# Patient Record
Sex: Female | Born: 1935 | Race: White | Hispanic: No | Smoking: Never smoker
Health system: Southern US, Community
[De-identification: ages and names within clinical notes are randomized; demographics above are authoritative.]

## PROBLEM LIST (undated history)

## (undated) DIAGNOSIS — M1990 Unspecified osteoarthritis, unspecified site: Secondary | ICD-10-CM

## (undated) DIAGNOSIS — K219 Gastro-esophageal reflux disease without esophagitis: Secondary | ICD-10-CM

## (undated) DIAGNOSIS — C801 Malignant (primary) neoplasm, unspecified: Secondary | ICD-10-CM

## (undated) DIAGNOSIS — R413 Other amnesia: Secondary | ICD-10-CM

## (undated) DIAGNOSIS — I10 Essential (primary) hypertension: Secondary | ICD-10-CM

## (undated) DIAGNOSIS — R000 Tachycardia, unspecified: Secondary | ICD-10-CM

## (undated) DIAGNOSIS — I739 Peripheral vascular disease, unspecified: Secondary | ICD-10-CM

## (undated) DIAGNOSIS — M5481 Occipital neuralgia: Secondary | ICD-10-CM

## (undated) DIAGNOSIS — I209 Angina pectoris, unspecified: Secondary | ICD-10-CM

## (undated) DIAGNOSIS — R002 Palpitations: Secondary | ICD-10-CM

## (undated) DIAGNOSIS — H269 Unspecified cataract: Secondary | ICD-10-CM

## (undated) DIAGNOSIS — I351 Nonrheumatic aortic (valve) insufficiency: Secondary | ICD-10-CM

## (undated) DIAGNOSIS — R519 Headache, unspecified: Secondary | ICD-10-CM

## (undated) DIAGNOSIS — I447 Left bundle-branch block, unspecified: Secondary | ICD-10-CM

## (undated) DIAGNOSIS — M1120 Other chondrocalcinosis, unspecified site: Secondary | ICD-10-CM

## (undated) DIAGNOSIS — I2510 Atherosclerotic heart disease of native coronary artery without angina pectoris: Secondary | ICD-10-CM

## (undated) DIAGNOSIS — E785 Hyperlipidemia, unspecified: Secondary | ICD-10-CM

## (undated) DIAGNOSIS — I499 Cardiac arrhythmia, unspecified: Secondary | ICD-10-CM

## (undated) DIAGNOSIS — I700 Atherosclerosis of aorta: Secondary | ICD-10-CM

## (undated) DIAGNOSIS — C44311 Basal cell carcinoma of skin of nose: Secondary | ICD-10-CM

## (undated) DIAGNOSIS — M069 Rheumatoid arthritis, unspecified: Secondary | ICD-10-CM

## (undated) DIAGNOSIS — R778 Other specified abnormalities of plasma proteins: Secondary | ICD-10-CM

## (undated) DIAGNOSIS — E538 Deficiency of other specified B group vitamins: Secondary | ICD-10-CM

## (undated) HISTORY — PX: CATARACT EXTRACTION: SUR2

## (undated) HISTORY — PX: CORONARY ANGIOPLASTY: SHX604

## (undated) HISTORY — PX: BACK SURGERY: SHX140

## (undated) HISTORY — PX: OTHER SURGICAL HISTORY: SHX169

## (undated) HISTORY — PX: EYE SURGERY: SHX253

---

## 1991-03-01 HISTORY — PX: BACK SURGERY: SHX140

## 2005-01-07 ENCOUNTER — Ambulatory Visit: Payer: Self-pay | Admitting: Orthopedic Surgery

## 2005-01-14 ENCOUNTER — Ambulatory Visit: Payer: Self-pay | Admitting: Orthopedic Surgery

## 2007-01-31 ENCOUNTER — Ambulatory Visit: Payer: Self-pay | Admitting: Cardiology

## 2012-05-03 ENCOUNTER — Ambulatory Visit: Payer: Self-pay | Admitting: Internal Medicine

## 2012-06-19 ENCOUNTER — Ambulatory Visit: Payer: Self-pay | Admitting: Internal Medicine

## 2015-01-20 ENCOUNTER — Encounter: Payer: Self-pay | Admitting: *Deleted

## 2015-01-26 ENCOUNTER — Ambulatory Visit: Payer: Medicare Other | Admitting: *Deleted

## 2015-01-26 ENCOUNTER — Encounter: Payer: Self-pay | Admitting: *Deleted

## 2015-01-26 ENCOUNTER — Encounter
Admission: RE | Disposition: A | Discharge status: Home / Self Care | Payer: Self-pay | Source: Ambulatory Visit | Attending: Ophthalmology

## 2015-01-26 ENCOUNTER — Ambulatory Visit
Admission: RE | Admit: 2015-01-26 | Discharge: 2015-01-26 | Disposition: A | Discharge status: Home / Self Care | Payer: Medicare Other | Source: Ambulatory Visit | Attending: Ophthalmology | Admitting: Ophthalmology

## 2015-01-26 DIAGNOSIS — M1990 Unspecified osteoarthritis, unspecified site: Secondary | ICD-10-CM | POA: Insufficient documentation

## 2015-01-26 DIAGNOSIS — H25012 Cortical age-related cataract, left eye: Secondary | ICD-10-CM | POA: Insufficient documentation

## 2015-01-26 DIAGNOSIS — Z9841 Cataract extraction status, right eye: Secondary | ICD-10-CM | POA: Insufficient documentation

## 2015-01-26 DIAGNOSIS — I10 Essential (primary) hypertension: Secondary | ICD-10-CM | POA: Diagnosis not present

## 2015-01-26 DIAGNOSIS — Z886 Allergy status to analgesic agent status: Secondary | ICD-10-CM | POA: Insufficient documentation

## 2015-01-26 DIAGNOSIS — Z9861 Coronary angioplasty status: Secondary | ICD-10-CM | POA: Insufficient documentation

## 2015-01-26 DIAGNOSIS — K219 Gastro-esophageal reflux disease without esophagitis: Secondary | ICD-10-CM | POA: Diagnosis not present

## 2015-01-26 DIAGNOSIS — I499 Cardiac arrhythmia, unspecified: Secondary | ICD-10-CM | POA: Diagnosis not present

## 2015-01-26 DIAGNOSIS — H2512 Age-related nuclear cataract, left eye: Secondary | ICD-10-CM | POA: Diagnosis not present

## 2015-01-26 DIAGNOSIS — H269 Unspecified cataract: Secondary | ICD-10-CM | POA: Diagnosis present

## 2015-01-26 HISTORY — DX: Essential (primary) hypertension: I10

## 2015-01-26 HISTORY — DX: Gastro-esophageal reflux disease without esophagitis: K21.9

## 2015-01-26 HISTORY — PX: CATARACT EXTRACTION W/PHACO: SHX586

## 2015-01-26 HISTORY — DX: Unspecified osteoarthritis, unspecified site: M19.90

## 2015-01-26 HISTORY — DX: Cardiac arrhythmia, unspecified: I49.9

## 2015-01-26 SURGERY — PHACOEMULSIFICATION, CATARACT, WITH IOL INSERTION
Anesthesia: Monitor Anesthesia Care | Site: Eye | Laterality: Left | Wound class: Clean

## 2015-01-26 MED ORDER — TETRACAINE HCL 0.5 % OP SOLN
OPHTHALMIC | Status: AC
  Filled 2015-01-26: qty 2

## 2015-01-26 MED ORDER — NA CHONDROIT SULF-NA HYALURON 40-17 MG/ML IO SOLN
INTRAOCULAR | Status: AC
  Filled 2015-01-26: qty 1

## 2015-01-26 MED ORDER — CYCLOPENTOLATE HCL 2 % OP SOLN
1.0000 [drp] | OPHTHALMIC | Status: DC | PRN
  Administered 2015-01-26: 1 [drp] via OPHTHALMIC

## 2015-01-26 MED ORDER — MOXIFLOXACIN HCL 0.5 % OP SOLN
OPHTHALMIC | Status: DC | PRN
  Administered 2015-01-26: 1 [drp] via OPHTHALMIC

## 2015-01-26 MED ORDER — LIDOCAINE HCL (PF) 4 % IJ SOLN
INTRAMUSCULAR | Status: AC
  Filled 2015-01-26: qty 5

## 2015-01-26 MED ORDER — BUPIVACAINE HCL (PF) 0.75 % IJ SOLN
INTRAMUSCULAR | Status: AC
  Filled 2015-01-26: qty 10

## 2015-01-26 MED ORDER — PROPARACAINE HCL 0.5 % OP SOLN
OPHTHALMIC | Status: AC
  Filled 2015-01-26: qty 15

## 2015-01-26 MED ORDER — TRYPAN BLUE 0.06 % OP SOLN
OPHTHALMIC | Status: AC
  Filled 2015-01-26: qty 0.5

## 2015-01-26 MED ORDER — PHENYLEPHRINE HCL 10 % OP SOLN
OPHTHALMIC | Status: AC
  Filled 2015-01-26: qty 5

## 2015-01-26 MED ORDER — MOXIFLOXACIN HCL 0.5 % OP SOLN
1.0000 [drp] | OPHTHALMIC | Status: DC | PRN
  Administered 2015-01-26: 1 [drp] via OPHTHALMIC

## 2015-01-26 MED ORDER — EPINEPHRINE HCL 1 MG/ML IJ SOLN
INTRAOCULAR | Status: DC | PRN
  Administered 2015-01-26: 1 mL via OPHTHALMIC

## 2015-01-26 MED ORDER — ALFENTANIL 500 MCG/ML IJ INJ
INJECTION | INTRAMUSCULAR | Status: DC | PRN
  Administered 2015-01-26: 500 ug via INTRAVENOUS

## 2015-01-26 MED ORDER — HYALURONIDASE HUMAN 150 UNIT/ML IJ SOLN
INTRAMUSCULAR | Status: AC
  Filled 2015-01-26: qty 1

## 2015-01-26 MED ORDER — LIDOCAINE HCL (PF) 4 % IJ SOLN
INTRAMUSCULAR | Status: DC | PRN
  Administered 2015-01-26: 4 mL via OPHTHALMIC

## 2015-01-26 MED ORDER — EPINEPHRINE HCL 1 MG/ML IJ SOLN
INTRAMUSCULAR | Status: AC
  Filled 2015-01-26: qty 1

## 2015-01-26 MED ORDER — MOXIFLOXACIN HCL 0.5 % OP SOLN
OPHTHALMIC | Status: AC
  Filled 2015-01-26: qty 3

## 2015-01-26 MED ORDER — NA CHONDROIT SULF-NA HYALURON 40-17 MG/ML IO SOLN
INTRAOCULAR | Status: DC | PRN
  Administered 2015-01-26: 1 mL via INTRAOCULAR

## 2015-01-26 MED ORDER — TETRACAINE HCL 0.5 % OP SOLN
OPHTHALMIC | Status: DC | PRN
  Administered 2015-01-26: 1 [drp] via OPHTHALMIC

## 2015-01-26 MED ORDER — BSS IO SOLN
INTRAOCULAR | Status: DC | PRN
  Administered 2015-01-26: .5 mL via OPHTHALMIC

## 2015-01-26 MED ORDER — CEFUROXIME OPHTHALMIC INJECTION 1 MG/0.1 ML
INJECTION | OPHTHALMIC | Status: DC | PRN
Start: 2015-01-26 — End: 2015-01-26
  Administered 2015-01-26: .1 mL via INTRACAMERAL

## 2015-01-26 MED ORDER — PHENYLEPHRINE HCL 10 % OP SOLN
1.0000 [drp] | OPHTHALMIC | Status: DC | PRN
  Administered 2015-01-26: 1 [drp] via OPHTHALMIC

## 2015-01-26 MED ORDER — CARBACHOL 0.01 % IO SOLN
INTRAOCULAR | Status: DC | PRN
  Administered 2015-01-26: .5 mL via INTRAOCULAR

## 2015-01-26 MED ORDER — GLYCOPYRROLATE 0.2 MG/ML IJ SOLN
INTRAMUSCULAR | Status: DC | PRN
  Administered 2015-01-26: 0.2 mg via INTRAVENOUS

## 2015-01-26 MED ORDER — SODIUM CHLORIDE 0.9 % IV SOLN
INTRAVENOUS | Status: DC
  Administered 2015-01-26: 09:00:00 via INTRAVENOUS

## 2015-01-26 MED ORDER — CYCLOPENTOLATE HCL 2 % OP SOLN
OPHTHALMIC | Status: AC
  Filled 2015-01-26: qty 2

## 2015-01-26 SURGICAL SUPPLY — 29 items

## 2015-01-26 NOTE — Transfer of Care (Signed)
Immediate Anesthesia Transfer of Care Note  Patient: Alicia Lambert  Procedure(s) Performed: Procedure(s) with comments: CATARACT EXTRACTION PHACO AND INTRAOCULAR LENS PLACEMENT (IOC) (Left) - [place] 01:49 AP% 24.2 CDE 45.30 fluid pack lot # [id number]  Patient Location: PACU and Short Stay  Anesthesia Type:MAC  Level of Consciousness: awake, alert  and oriented  Airway & Oxygen Therapy: Patient Spontanous Breathing  Post-op Assessment: Report given to RN and Post -op Vital signs reviewed and stable  Post vital signs: Reviewed and stable  Last Vitals:  Filed Vitals:   01/26/15 0745 01/26/15 0950  BP: 151/70 147/70  Pulse: 56   Temp: 36.7 C 36.4 C  Resp: 16 18    Complications: No apparent anesthesia complications

## 2015-01-26 NOTE — Op Note (Signed)
Date of Surgery: 01/26/2015 Date of Dictation: 01/26/2015 9:46 AM Pre-operative Diagnosis:  Nuclear Sclerotic Cataract and Cortical Cataract left Eye Post-operative Diagnosis: same Procedure performed: Extra-capsular Cataract Extraction (ECCE) with placement of a posterior chamber intraocular lens (IOL) left Eye IOL:  Implant Name Type Inv. Item Serial No. Manufacturer Lot No. LRB No. Used  LENS IOL ACRYSOF IQ 20.5 - [id number] Intraocular Lens LENS IOL ACRYSOF IQ 20.5 [id number] ALCON   Left 1   Anesthesia: 2% Lidocaine and 4% Marcaine in a 50/50 mixture with 10 unites/ml of Hylenex given as a peribulbar Anesthesiologist: Anesthesiologist: Elyse Hsu, MD CRNA: Jonna Clark, CRNA Complications: none Estimated Blood Loss: less than 1 ml  Description of procedure:  The patient was given anesthesia and sedation via intravenous access. The patient was then prepped and draped in the usual fashion. A 25-gauge needle was bent for initiating the capsulorhexis. A 5-0 silk suture was placed through the conjunctiva superior and inferiorly to serve as bridle sutures. Hemostasis was obtained at the superior limbus using an eraser cautery. A partial thickness groove was made at the anterior surgical limbus with a 64 Beaver blade and this was dissected anteriorly with an Avaya. The anterior chamber was entered at 10 o'clock with a 1.0 mm paracentesis knife and through the lamellar dissection with a 2.6 mm Alcon keratome. Epi-Shugarcaine 0.5 CC [9 cc BSS Plus (Alcon), 3 cc 4% preservative-free lidocaine (Hospira) and 4 cc 1:1000 preservative-free, bisulfite-free epinephrine] was injected into the anterior chamber via the paracentesis tract. Epi-Shugarcaine 0.5 CC [9 cc BSS Plus (Alcon), 3 cc 4% preservative-free lidocaine (Hospira) and 4 cc 1:1000 preservative-free, bisulfite-free epinephrine] was injected into the anterior chamber via the paracentesis tract. DiscoVisc was injected to replace  the aqueous and a continuous tear curvilinear capsulorhexis was performed using a bent 25-gauge needle.  Balance salt on a syringe was used to perform hydro-dissection and phacoemulsification was carried out using a divide and conquer technique. Procedure(s) with comments: CATARACT EXTRACTION PHACO AND INTRAOCULAR LENS PLACEMENT (IOC) (Left) - [place] 01:49 AP% 24.2 CDE 45.30 fluid pack lot # [id number]. Irrigation/aspiration was used to remove the residual cortex and the capsular bag was inflated with DiscoVisc. The intraocular lens was inserted into the capsular bag using a pre-loaded UltraSert Delivery System. Irrigation/aspiration was used to remove the residual DiscoVisc. The wound was inflated with balanced salt and checked for leaks. None were found. Miostat was injected via the paracentesis track and 0.1 ml of cefuroxime containing 1 mg of drug  was injected via the paracentesis track. The wound was checked for leaks again and none were found.   The bridal sutures were removed and two drops of Vigamox were placed on the eye. An eye shield was placed to protect the eye and the patient was discharged to the recovery area in good condition.   Sidra Oldfield MD

## 2015-01-26 NOTE — Discharge Instructions (Signed)
Eye Surgery Discharge Instructions  Expect mild scratchy sensation or mild soreness. DO NOT RUB YOUR EYE!  The day of surgery:  Minimal physical activity, but bed rest is not required  No reading, computer work, or close hand work  No bending, lifting, or straining.  May watch TV  For 24 hours:  No driving, legal decisions, or alcoholic beverages  Safety precautions  Eat anything you prefer: It is better to start with liquids, then soup then solid foods.  _____ Eye patch should be worn until postoperative exam tomorrow.  ____ Solar shield eyeglasses should be worn for comfort in the sunlight/patch while sleeping  Resume all regular medications including aspirin or Coumadin if these were discontinued prior to surgery. You may shower, bathe, shave, or wash your hair. Tylenol may be taken for mild discomfort.  Call your doctor if you experience significant pain, nausea, or vomiting, fever > 101 or other signs of infection. [phone] or [phone] Specific instructions:  Follow-up Information    Follow up with Estill Cotta, MD.   Specialty:  Ophthalmology   Why:  01/27/15 @ 10:40 am   Contact information:   [address]   [place] [address] [phone]

## 2015-01-26 NOTE — Anesthesia Postprocedure Evaluation (Signed)
Anesthesia Post Note  Patient: Alicia Lambert  Procedure(s) Performed: Procedure(s) (LRB): CATARACT EXTRACTION PHACO AND INTRAOCULAR LENS PLACEMENT (IOC) (Left)  Patient location during evaluation: Short Stay Anesthesia Type: MAC Level of consciousness: awake and alert and oriented Pain management: pain level controlled Vital Signs Assessment: post-procedure vital signs reviewed and stable Respiratory status: spontaneous breathing Cardiovascular status: stable Postop Assessment: No headache Anesthetic complications: no    Last Vitals:  Filed Vitals:   01/26/15 0948 01/26/15 0950  BP: 147/70 147/70  Pulse: 58   Temp: 35.6 C 36.4 C  Resp: 16 18    Last Pain: There were no vitals filed for this visit.               Lanora Manis

## 2015-01-26 NOTE — H&P (Signed)
See scanned note.

## 2015-01-26 NOTE — Anesthesia Preprocedure Evaluation (Signed)
Anesthesia Evaluation  Patient identified by MRN, date of birth, ID band Patient awake    Reviewed: Allergy & Precautions, NPO status , Patient's Chart, lab work & pertinent test results  Airway Mallampati: III  TM Distance: >3 FB Neck ROM: Limited    Dental  (+) Teeth Intact   Pulmonary    Pulmonary exam normal        Cardiovascular Exercise Tolerance: Good hypertension, Pt. on medications and Pt. on home beta blockers Normal cardiovascular exam  Angioplasty and has done well.   Neuro/Psych    GI/Hepatic negative GI ROS, Denies GERD.   Endo/Other    Renal/GU      Musculoskeletal  (+) Arthritis , Osteoarthritis,    Abdominal (+)  Abdomen: soft.    Peds  Hematology   Anesthesia Other Findings   Reproductive/Obstetrics                             Anesthesia Physical Anesthesia Plan  ASA: III  Anesthesia Plan: MAC   Post-op Pain Management:    Induction: Intravenous  Airway Management Planned: Nasal Cannula  Additional Equipment:   Intra-op Plan:   Post-operative Plan:   Informed Consent: I have reviewed the patients History and Physical, chart, labs and discussed the procedure including the risks, benefits and alternatives for the proposed anesthesia with the patient or authorized representative who has indicated his/her understanding and acceptance.     Plan Discussed with: CRNA  Anesthesia Plan Comments:         Anesthesia Quick Evaluation

## 2015-01-26 NOTE — Interval H&P Note (Signed)
History and Physical Interval Note:  01/26/2015 8:56 AM  Alicia Lambert  has presented today for surgery, with the diagnosis of cataract  The various methods of treatment have been discussed with the patient and family. After consideration of risks, benefits and other options for treatment, the patient has consented to  Procedure(s): CATARACT EXTRACTION PHACO AND INTRAOCULAR LENS PLACEMENT ([place]) (Left) as a surgical intervention .  The patient's history has been reviewed, patient examined, no change in status, stable for surgery.  I have reviewed the patient's chart and labs.  Questions were answered to the patient's satisfaction.     Keyon Winnick

## 2015-03-24 ENCOUNTER — Ambulatory Visit: Payer: Medicare HMO | Attending: Orthopedic Surgery | Admitting: Occupational Therapy

## 2015-03-24 DIAGNOSIS — M25641 Stiffness of right hand, not elsewhere classified: Secondary | ICD-10-CM | POA: Insufficient documentation

## 2015-03-24 DIAGNOSIS — M6281 Muscle weakness (generalized): Secondary | ICD-10-CM | POA: Diagnosis present

## 2015-03-24 DIAGNOSIS — M79641 Pain in right hand: Secondary | ICD-10-CM | POA: Diagnosis present

## 2015-03-24 NOTE — Therapy (Signed)
[facility] [address], [place], [address] Phone: [phone]   Fax:  [phone]  Occupational Therapy Treatment  Patient Details  Name: Alicia Lambert MRN: [id number] Date of Birth: 1927-12-13 Referring Provider: Rudene Christians  Encounter Date: 03/24/2015      OT End of Session - 03/24/15 1840    Visit Number 1   Number of Visits 3   Date for OT Re-Evaluation 04/14/15   OT Start Time 1015   OT Stop Time 1116   OT Time Calculation (min) 61 min   Activity Tolerance Patient tolerated treatment well   Behavior During Therapy [facility] for tasks assessed/performed      Past Medical History  Diagnosis Date  . Dysrhythmia   . Hypertension   . GERD (gastroesophageal reflux disease)   . Arthritis     Past Surgical History  Procedure Laterality Date  . Back surgery    . Ctr    . Eye surgery    . Coronary angioplasty    . Cataract extraction w/phaco Left 01/26/2015    Procedure: CATARACT EXTRACTION PHACO AND INTRAOCULAR LENS PLACEMENT (IOC);  Surgeon: Estill Cotta, MD;  Location: [facility] ORS;  Service: Ophthalmology;  Laterality: Left;  [place] 01:49 AP% 24.2 CDE 45.30 fluid pack lot # [id number]    There were no vitals filed for this visit.  Visit Diagnosis:  Pain of right hand - Plan: Ot plan of care cert/re-cert  Stiffness of finger joint of right hand - Plan: Ot plan of care cert/re-cert  Muscle weakness - Plan: Ot plan of care cert/re-cert      Subjective Assessment - 03/24/15 1818    Subjective  My R hand gradually got worse - pain and edema - it started in my pinkie last time and Dr Rudene Christians gave me a shot - it is all my fingers and over my knuckles - stiff and pain - cannot grip objects - hard time open bottles, cut food , or with scissors, squeeze anything - I  stopped using my R hand - and now my L hand starting same thing on the pinkie    Patient Stated Goals Want to get the pain and stiffness better- use  it like before - carry objects, use in bathing and dressing, turn doorknob , , push up with my hand    Currently in Pain? Yes   Pain Score 6    Pain Location Hand   Pain Orientation Right;Left   Pain Descriptors / Indicators Aching   Pain Type Chronic pain   Pain Onset More than a month ago            [facility] OT Assessment - 03/24/15 0001    Assessment   Diagnosis R hand pain in joints    Referring Provider Menz   Onset Date 04/29/14   Assessment Pt report pain had been in R hand for while and gradually got worse - started with her pinkie- more stiffness and pain - sometimes numbness - having harder time using them - L hand starting with pinkie too - no grip - had shot in past in R pinkie that helped - refer by MD for therapy    Home  Environment   Lives With Alone   Prior Function   Level of [name]   Leisure She is always busy and likes to be outside per pt - she is R hand dominant - works in yard Set designer,  cook , help her older sister that lives next door,    Strength   Right Hand Grip (lbs) 10   Right Hand Lateral Pinch 5 lbs   Right Hand 3 Point Pinch 5 lbs   Left Hand Grip (lbs) 30   Left Hand Lateral Pinch 8 lbs   Left Hand 3 Point Pinch 6 lbs   Right Hand AROM   R Thumb Opposition to Index --  Pull when reaching to bottom for 5th    R Index  MCP 0-90 75 Degrees   R Index PIP 0-100 100 Degrees   R Long  MCP 0-90 85 Degrees   R Long PIP 0-100 100 Degrees   R Ring  MCP 0-90 8 Degrees   R Ring PIP 0-100 100 Degrees   R Little  MCP 0-90 90 Degrees   R Little PIP 0-100 60 Degrees                          OT Education - 03/24/15 1840    Education provided Yes   Education Details see pt instruction   Person(s) Educated Patient   Methods Explanation;Demonstration;Tactile cues;Verbal cues;Handout   Comprehension Verbal cues required;Returned demonstration;Verbalized understanding          OT Short Term Goals -  03/24/15 1847    OT SHORT TERM GOAL #1   Title Decrease pain on PRWHE by at least 10 points    Baseline PRWHE for pain 41/50   Time 2   Period Weeks   Status New   OT SHORT TERM GOAL #2   Title R 5th digit PIP improve with 10 degrees at least    Baseline PIP flexion at 5th 60   Time 3   Period Weeks   Status New           OT Long Term Goals - 03/24/15 1848    OT LONG TERM GOAL #1   Title PRWHE function improve with  at least 15 points   Baseline 33.5/50 function on PRWHE    Time 3   Period Weeks   Status New   OT LONG TERM GOAL #2   Title Pt verbalize 3 joint protection and AE to increase ease at home with ADL's and IADL's    Baseline very little knowledge   Time 3   Period Weeks   Status New               Plan - 03/24/15 1841    Clinical Impression Statement Pt present with pain/edema in R hand - mostly over 2nd and 3rd MC's but per report having it over whole hand at times when using it alot - but mornings stiffness and pain the worse - pt show decrease ROM mostly in 5th PIP - decrease grip  and prehension strength - pt  report  only numbness at times in 4th/5h - Pt was negative for Cubital tunnel  , and  negative for Tinel  and  Phalens test for CTS - pt do report cervical issue - with pain at times - if no improvement in 2 wks will  recommend cervical screen and  nerve conduction    Pt will benefit from skilled therapeutic intervention in order to improve on the following deficits (Retired) Impaired flexibility;Decreased range of motion;Decreased knowledge of precautions;Decreased knowledge of use of DME;Impaired UE functional use;Pain;Decreased strength   Rehab Potential Fair   OT Frequency 2x / week   OT  Duration 2 weeks   OT Treatment/Interventions Self-care/ADL training;DME and/or AE instruction;Patient/family education;Therapeutic exercises;Parrafin;Manual Therapy;Passive range of motion;Therapeutic exercise;Contrast Bath;Fluidtherapy   Plan assess home  program and if she modified the activities    OT Home Exercise Plan see pt instruction    Consulted and Agree with Plan of Care Patient          G-Codes - 04-07-2015 1851    Functional Assessment Tool Used PRWHE, pain ,ROM , grip , clinical judgement   Functional Limitation Self care   Self Care Current Status [id number]) At least 40 percent but less than 60 percent impaired, limited or restricted   Self Care Goal Status [id number]) At least 20 percent but less than 40 percent impaired, limited or restricted      Problem List There are no active problems to display for this patient.   Rosalyn Gess OTR/L,CLT 07-Apr-2015, 6:55 PM  [facility] [address] [place], [address] Phone: [phone]   Fax:  [phone]  Name: Alicia Lambert MRN: [id number] Date of Birth: Nov 27, 1927

## 2015-03-24 NOTE — Patient Instructions (Signed)
AROM for Tendon glides after contrast bath for edema and stiffness/pain  8 reps each  Stop when feeling pull   Joint protection education done  Adaptive Equipment ed done - Hand out provided

## 2015-03-31 ENCOUNTER — Ambulatory Visit: Payer: Medicare HMO | Admitting: Occupational Therapy

## 2015-03-31 DIAGNOSIS — M25641 Stiffness of right hand, not elsewhere classified: Secondary | ICD-10-CM

## 2015-03-31 DIAGNOSIS — M79641 Pain in right hand: Secondary | ICD-10-CM

## 2015-03-31 DIAGNOSIS — M6281 Muscle weakness (generalized): Secondary | ICD-10-CM

## 2015-03-31 NOTE — Therapy (Signed)
[facility] [address] [place], [address] Phone: [phone]   Fax:  [phone]  Occupational Therapy Treatment  Patient Details  Name: Alicia Lambert MRN: [id number] Date of Birth: 08-Jan-1928 Referring Provider: Rudene Christians  Encounter Date: 03/31/2015      OT End of Session - 03/31/15 1039    Visit Number 2   Number of Visits 3   Date for OT Re-Evaluation 04/14/15   OT Start Time 1016   OT Stop Time 1100   OT Time Calculation (min) 44 min   Activity Tolerance Patient tolerated treatment well   Behavior During Therapy [name] for tasks assessed/performed      Past Medical History  Diagnosis Date  . Dysrhythmia   . Hypertension   . GERD (gastroesophageal reflux disease)   . Arthritis     Past Surgical History  Procedure Laterality Date  . Back surgery    . Ctr    . Eye surgery    . Coronary angioplasty    . Cataract extraction w/phaco Left 01/26/2015    Procedure: CATARACT EXTRACTION PHACO AND INTRAOCULAR LENS PLACEMENT (IOC);  Surgeon: Estill Cotta, MD;  Location: [facility] ORS;  Service: Ophthalmology;  Laterality: Left;  [place] 01:49 AP% 24.2 CDE 45.30 fluid pack lot # [id number]    There were no vitals filed for this visit.  Visit Diagnosis:  Pain of right hand  Stiffness of finger joint of right hand  Muscle weakness      Subjective Assessment - 03/31/15 1029    Subjective  I think my finger is little better- can bend little more - but in the am  my finger and hand stiff  and more pain - and then some of the pain goes up my side for forearm    Patient Stated Goals Want to get the pain and stiffness better- use it like before - carry objects, use in bathing and dressing, turn doorknob , , push up with my hand    Currently in Pain? Yes   Pain Score 6    Pain Location Finger (Comment which one)   Pain Orientation Right   Pain Descriptors / Indicators Aching   Pain Type Chronic pain                       OT Treatments/Exercises (OP) - 03/31/15 0001    Hand Exercises   Other Hand Exercises AROM lumbrical , PROM of 4th DIP , AROM blocked intrinsic fist, full fist to 2 cm foam AROM , then 1 cm foam block    Other Hand Exercises Needed verbal cues to do composite flexion including DIP of 5th  and not to squeeze hard and try forcing to palm    RUE Contrast Bath   Time 11 minutes   Comments prior to ROM  to decrease pain and increase ROM prior to manual and ROM - pain decrease to 2/10 form 6/10   Manual Therapy   Manual therapy comments soft tissue mobs to MC's of hand - and forearm on ulnar side -- tenderness and pain over 2nd and 3rd MC's and dorsal hand ulnar side                  OT Education - 03/31/15 1038    Education provided Yes   Education Details See pt instuction   Person(s) Educated Patient   Methods Explanation;Demonstration;Tactile cues   Comprehension Verbalized understanding;Returned demonstration  OT Short Term Goals - 03/24/15 1847    OT SHORT TERM GOAL #1   Title Decrease pain on PRWHE by at least 10 points    Baseline PRWHE for pain 41/50   Time 2   Period Weeks   Status New   OT SHORT TERM GOAL #2   Title R 5th digit PIP improve with 10 degrees at least    Baseline PIP flexion at 5th 60   Time 3   Period Weeks   Status New           OT Long Term Goals - 03/24/15 1848    OT LONG TERM GOAL #1   Title PRWHE function improve with  at least 15 points   Baseline 33.5/50 function on PRWHE    Time 3   Period Weeks   Status New   OT LONG TERM GOAL #2   Title Pt verbalize 3 joint protection and AE to increase ease at home with ADL's and IADL's    Baseline very little knowledge   Time 3   Period Weeks   Status New               Plan - 03/31/15 1100    Clinical Impression Statement Pt pain still about 6/10 coming in - did reinforce for pt to not over do and work fingers during day to much - but  stick with 2-3 x day - AROM at 5th PIP improved but  report numbness over radial 3 digis - did wear hre old CT splint yesterday when doing  tasks - did not had any triggerpoints over forearm - some tenderness over CT  and ulnar side of hand /MC of 2nd digitt    Pt will benefit from skilled therapeutic intervention in order to improve on the following deficits (Retired) Impaired flexibility;Decreased range of motion;Decreased knowledge of precautions;Decreased knowledge of use of DME;Impaired UE functional use;Pain;Decreased strength   Rehab Potential Fair   OT Frequency 2x / week   OT Duration 2 weeks   OT Treatment/Interventions Self-care/ADL training;DME and/or AE instruction;Patient/family education;Therapeutic exercises;Parrafin;Manual Therapy;Passive range of motion;Therapeutic exercise;Contrast Bath;Fluidtherapy   Plan assess pain , ROM , assess splint fit    OT Home Exercise Plan see pt instruction    Consulted and Agree with Plan of Care Patient        Problem List There are no active problems to display for this patient.   Rosalyn Gess OTR/L,CLT 03/31/2015, 11:52 AM  [facility] [address] [place], [address] Phone: [phone]   Fax:  [phone]  Name: Alicia Lambert MRN: [id number] Date of Birth: Jul 29, 1927

## 2015-03-31 NOTE — Patient Instructions (Signed)
Tendon glides  Contrast prior

## 2015-04-02 ENCOUNTER — Ambulatory Visit: Payer: Medicare HMO | Attending: Orthopedic Surgery | Admitting: Occupational Therapy

## 2015-04-02 DIAGNOSIS — M25641 Stiffness of right hand, not elsewhere classified: Secondary | ICD-10-CM | POA: Diagnosis present

## 2015-04-02 DIAGNOSIS — M6281 Muscle weakness (generalized): Secondary | ICD-10-CM

## 2015-04-02 DIAGNOSIS — M79641 Pain in right hand: Secondary | ICD-10-CM | POA: Diagnosis present

## 2015-04-02 NOTE — Therapy (Signed)
[facility] [address] [place], [address] Phone: [phone]   Fax:  [phone]  Occupational Therapy Treatment  Patient Details  Name: Alicia Lambert MRN: [id number] Date of Birth: 1927/09/15 Referring Provider: Rudene Christians  Encounter Date: 04/02/2015      OT End of Session - 04/02/15 1838    Visit Number 3   Number of Visits 4   Date for OT Re-Evaluation 04/14/15   OT Start Time 1015   OT Stop Time 1103   OT Time Calculation (min) 48 min   Activity Tolerance Patient tolerated treatment well   Behavior During Therapy [facility] for tasks assessed/performed      Past Medical History  Diagnosis Date  . Dysrhythmia   . Hypertension   . GERD (gastroesophageal reflux disease)   . Arthritis     Past Surgical History  Procedure Laterality Date  . Back surgery    . Ctr    . Eye surgery    . Coronary angioplasty    . Cataract extraction w/phaco Left 01/26/2015    Procedure: CATARACT EXTRACTION PHACO AND INTRAOCULAR LENS PLACEMENT (IOC);  Surgeon: Estill Cotta, MD;  Location: [facility] ORS;  Service: Ophthalmology;  Laterality: Left;  [place] 01:49 AP% 24.2 CDE 45.30 fluid pack lot # [id number]    There were no vitals filed for this visit.  Visit Diagnosis:  Pain of right hand  Stiffness of finger joint of right hand  Muscle weakness      Subjective Assessment - 04/02/15 1023    Subjective  MY finger is sore this am - I forgot to wear my splint yesterday - but I use my hands a lot - I do have some neck issues - pain goes up in the forearm    Patient Stated Goals Want to get the pain and stiffness better- use it like before - carry objects, use in bathing and dressing, turn doorknob , , push up with my hand    Currently in Pain? Yes   Pain Score 7    Pain Location Finger (Comment which one)   Pain Orientation Right   Pain Descriptors / Indicators Aching   Pain Type Chronic pain   Pain Onset More than a  month ago            [facility] OT Assessment - 04/02/15 0001    Right Hand AROM   R Little PIP 0-100 80 Degrees                  OT Treatments/Exercises (OP) - 04/02/15 0001    Hand Exercises   Other Hand Exercises PROM for DIP of 5th , and PIP - follow by AROM intrinsic fist and full fist to 2 cm foam block    Other Hand Exercises Cubital tunnel  neg for tenderness - pt are tender along ulnar side of wrist nad forearm    Ultrasound   Ultrasound Location 2nd and 3rd MC and 5th MC    Ultrasound Parameters [place] at 3.3MHZ, 20% and 1.0 intensity  for 5 min at end of session    Ultrasound Goals Pain   RUE Contrast Bath   Time 11 minutes   Comments AT [facility] to increase ROM and decrease pain    Manual Therapy   Manual therapy comments soft tissue mobs to MC's of hand - and forearm on ulnar side -- tenderness and pain over 2nd and 3rd MC's and dorsal hand ulnar side  OT Education - 04/02/15 1838    Education provided Yes   Education Details HEP   Person(s) Educated Patient   Methods Explanation;Tactile cues;Demonstration   Comprehension Verbalized understanding          OT Short Term Goals - 03/24/15 1847    OT SHORT TERM GOAL #1   Title Decrease pain on PRWHE by at least 10 points    Baseline PRWHE for pain 41/50   Time 2   Period Weeks   Status New   OT SHORT TERM GOAL #2   Title R 5th digit PIP improve with 10 degrees at least    Baseline PIP flexion at 5th 60   Time 3   Period Weeks   Status New           OT Long Term Goals - 03/24/15 1848    OT LONG TERM GOAL #1   Title PRWHE function improve with  at least 15 points   Baseline 33.5/50 function on PRWHE    Time 3   Period Weeks   Status New   OT LONG TERM GOAL #2   Title Pt verbalize 3 joint protection and AE to increase ease at home with ADL's and IADL's    Baseline very little knowledge   Time 3   Period Weeks   Status New               Plan - 04/02/15 1839     Clinical Impression Statement Pt cont to have increase pain over 5th MC and ulnar side of hand - as well as 2ndand 3rd MC's - pt denies numnbness over ulnar side of hand and negative for Cubital tunnel - at time some sensory changes over 2ndand 3rd - ? CT - had PT do cervical screen this date -  had soms issues at C6 but  nothing that would cause pt's symptoms - ?  inflammatory flare up / ? arthritic    Pt will benefit from skilled therapeutic intervention in order to improve on the following deficits (Retired) Impaired flexibility;Decreased range of motion;Decreased knowledge of precautions;Decreased knowledge of use of DME;Impaired UE functional use;Pain;Decreased strength   Rehab Potential Fair   OT Frequency 1x / week   OT Duration Other (comment)   OT Treatment/Interventions Self-care/ADL training;DME and/or AE instruction;Patient/family education;Therapeutic exercises;Parrafin;Manual Therapy;Passive range of motion;Therapeutic exercise;Contrast Bath;Fluidtherapy   Plan assess progress and message in to Dr Rudene Christians at discuss pt    [place] see pt instruction    Consulted and Agree with Plan of Care Patient        Problem List There are no active problems to display for this patient.   Rosalyn Gess OTR/L,CLT 04/02/2015, 6:45 PM  [place] [facility] [address], [place], [address] Phone: [phone]   Fax:  [phone]  Name: Alicia Lambert MRN: [id number] Date of Birth: April 09, 1927

## 2015-04-02 NOTE — Patient Instructions (Signed)
Same as before.

## 2015-04-07 ENCOUNTER — Ambulatory Visit: Payer: Medicare HMO | Admitting: Occupational Therapy

## 2015-04-07 DIAGNOSIS — M79641 Pain in right hand: Secondary | ICD-10-CM

## 2015-04-07 DIAGNOSIS — M25641 Stiffness of right hand, not elsewhere classified: Secondary | ICD-10-CM

## 2015-04-07 DIAGNOSIS — M6281 Muscle weakness (generalized): Secondary | ICD-10-CM

## 2015-04-07 NOTE — Therapy (Signed)
[facility] [address], [place], [address] Phone: [phone]   Fax:  [phone]  Occupational Therapy Treatment and discharge  Patient Details  Name: Alicia Lambert MRN: [id number] Date of Birth: 1927-07-08 Referring Provider: Rudene Christians  Encounter Date: 04/07/2015      OT End of Session - 04/07/15 1320    Visit Number 4   Number of Visits 4   Date for OT Re-Evaluation 04/07/15   OT Start Time 1050   OT Stop Time 1128   OT Time Calculation (min) 38 min   Activity Tolerance Patient tolerated treatment well   Behavior During Therapy [facility] for tasks assessed/performed      Past Medical History  Diagnosis Date  . Dysrhythmia   . Hypertension   . GERD (gastroesophageal reflux disease)   . Arthritis     Past Surgical History  Procedure Laterality Date  . Back surgery    . Ctr    . Eye surgery    . Coronary angioplasty    . Cataract extraction w/phaco Left 01/26/2015    Procedure: CATARACT EXTRACTION PHACO AND INTRAOCULAR LENS PLACEMENT (IOC);  Surgeon: Estill Cotta, MD;  Location: [facility] ORS;  Service: Ophthalmology;  Laterality: Left;  [place] 01:49 AP% 24.2 CDE 45.30 fluid pack lot # [id number]    There were no vitals filed for this visit.  Visit Diagnosis:  Pain of right hand  Stiffness of finger joint of right hand  Muscle weakness      Subjective Assessment - 04/07/15 1142    Subjective  I used my hands a lot yesterday and had them in water - and they felt great but this am again my wrist hurting , pinkie , index - they are swelling , stiff  and painfull - I wear sometimes wrist splint - but not doing anything and my L pinkie hurting too    Patient Stated Goals Want to get the pain and stiffness better- use it like before - carry objects, use in bathing and dressing, turn doorknob , , push up with my hand    Currently in Pain? Yes   Pain Score 8    Pain Location Finger (Comment which one)   Pain Orientation Right;Left   Pain Descriptors / Indicators Aching;Burning            [facility] OT Assessment - 04/07/15 0001    Strength   Right Hand Grip (lbs) 10   Right Hand Lateral Pinch 5 lbs   Right Hand 3 Point Pinch 3 lbs   Left Hand Grip (lbs) 22   Left Hand Lateral Pinch 9 lbs   Left Hand 3 Point Pinch 7 lbs   Right Hand AROM   R Index  MCP 0-90 66 Degrees   R Little PIP 0-100 80 Degrees                  OT Treatments/Exercises (OP) - 04/07/15 0001    Hand Exercises   Other Hand Exercises AAROM for R 5th PIP and DIP , composite fist ;  painfull 2nd MC - only PROM to 65 degrees    Other Hand Exercises Measurements for ROM - grip /prehension strength    RUE Contrast Bath   Time 11 minutes   Comments SOC for R hand      Manual Therapy   Manual therapy comments after contrast - prior to ROM - soft tissue mobs to MC's of hand - and forearm  on ulnar side -- tenderness and pain over 2nd and 3rd MC's and dorsal hand ulnar side  - this date also palmar wrist and up into forearm                 OT Education - 04/07/15 1319    Education provided Yes   Education Details results from assessment    Person(s) Educated Patient   Methods Explanation;Demonstration   Comprehension Verbalized understanding;Returned demonstration          OT Short Term Goals - 04/07/15 1322    OT SHORT TERM GOAL #1   Title Decrease pain on PRWHE by at least 10 points    Status Not Met   OT SHORT TERM GOAL #2   Title R 5th digit PIP improve with 10 degrees at least    Baseline PIP flexion increase to 80    Status Achieved           OT Long Term Goals - 04/07/15 1323    OT LONG TERM GOAL #1   Title PRWHE function improve with  at least 15 points   Baseline 33.5/50 function on PRWHE    Status Not Met               Plan - 04/07/15 1320    Clinical Impression Statement Did talk with DR Rudene Christians after last session - phoned and pt has appt with MD this Friday at  9h15 - pt cont to have increase pain at R wrist now and MC's of 2nd and 3rd MC - increase edema - pain cont to be on ulnar sdie of hand / and 5th MC -  grip  about the same , but prehension decrease on R - and then also symptoms now in L 5th - decrease grip strength - pt to see DR Rudene Christians Friday    OT Treatment/Interventions Self-care/ADL training;DME and/or AE instruction;Patient/family education;Therapeutic exercises;Parrafin;Manual Therapy;Passive range of motion;Therapeutic exercise;Contrast Bath;Fluidtherapy   Plan Pt discharge at this time -refer back to Dr Rudene Christians for further assessment    OT Home Exercise Plan see pt instruction    Consulted and Agree with Plan of Care Patient        Problem List There are no active problems to display for this patient.   Alicia Lambert OTR/L,CLT 04/07/2015, 1:24 PM  [place] [facility] [address], [place], [address] Phone: [phone]   Fax:  [phone]  Name: Alicia Lambert MRN: [id number] Date of Birth: Jul 08, 1927

## 2015-04-07 NOTE — Patient Instructions (Signed)
Cont with same until appt Friday with DR Rudene Christians

## 2016-05-04 ENCOUNTER — Ambulatory Visit (INDEPENDENT_AMBULATORY_CARE_PROVIDER_SITE_OTHER): Payer: Medicare HMO | Admitting: Podiatry

## 2016-05-04 ENCOUNTER — Encounter: Payer: Self-pay | Admitting: Podiatry

## 2016-05-04 ENCOUNTER — Ambulatory Visit (INDEPENDENT_AMBULATORY_CARE_PROVIDER_SITE_OTHER): Payer: Medicare HMO

## 2016-05-04 VITALS — BP 119/79 | HR 83 | Resp 16

## 2016-05-04 DIAGNOSIS — M7751 Other enthesopathy of right foot: Secondary | ICD-10-CM

## 2016-05-04 DIAGNOSIS — M778 Other enthesopathies, not elsewhere classified: Secondary | ICD-10-CM

## 2016-05-04 DIAGNOSIS — M722 Plantar fascial fibromatosis: Secondary | ICD-10-CM

## 2016-05-04 DIAGNOSIS — M779 Enthesopathy, unspecified: Secondary | ICD-10-CM

## 2016-05-04 NOTE — Progress Notes (Signed)
She presents today with a chief complaint of pain to the right foot and he heel and ankle times past 3-4 weeks. She states that it feels like there is a sharp shooting pain or throbbing pain from riding here she points to the sinus tarsi and around the lateral malleolus to the back of the heel. She states it is painful first thing in the morning. She's taken Tylenol which does help some degree. She's had an sinus tarsi injections in the past which is alleviated similar type symptoms.  Objective: Vital signs are stable she is alert and oriented 3. Pulses are palpable. Neurologic sensorium is intact. Deep tendon reflexes are intact. Muscle strength is 5 over 5 dorsiflexion plantar flexors and inverters everters all intrinsic musculature is intact. She has pain on end range of motion of the subtalar joint right. She has pain on direct palpation of the sinus tarsi right foot. No open wounds or lesions are noted. Radiographs do not demonstrate any type of fractures other than some mild osteopenia.  Assessment: Subtalar joint capsulitis right foot.  Plan: Injected the area today with Kenalog and local anesthetic.

## 2016-05-16 ENCOUNTER — Ambulatory Visit (INDEPENDENT_AMBULATORY_CARE_PROVIDER_SITE_OTHER): Payer: Medicare HMO | Admitting: Podiatry

## 2016-05-16 DIAGNOSIS — G5791 Unspecified mononeuropathy of right lower limb: Secondary | ICD-10-CM | POA: Diagnosis not present

## 2016-05-16 DIAGNOSIS — M7751 Other enthesopathy of right foot: Secondary | ICD-10-CM

## 2016-05-16 DIAGNOSIS — M779 Enthesopathy, unspecified: Principal | ICD-10-CM

## 2016-05-16 DIAGNOSIS — M778 Other enthesopathies, not elsewhere classified: Secondary | ICD-10-CM

## 2016-05-16 NOTE — Progress Notes (Signed)
She presents today with continued pain to the sinus tarsi area radiating beneath the lateral malleolus extending up the back of the leg. I injected her 2 weeks ago with Kenalog and local anesthetic. She states that her really hasn't helped very much.  Objective: Vital signs are stable alert and oriented 3. On palpation the areas much less painful than it was on previous visit however she is still painful in this area she states that there is a radiating type pain with palpation.  Assessment: Neuritis of the sinus tarsi capsulitis of the subtalar joint right foot.  Plan: Inject her first dose of dehydrated alcohol today.

## 2016-06-06 ENCOUNTER — Ambulatory Visit (INDEPENDENT_AMBULATORY_CARE_PROVIDER_SITE_OTHER): Payer: Medicare HMO | Admitting: Podiatry

## 2016-06-06 ENCOUNTER — Encounter: Payer: Self-pay | Admitting: Podiatry

## 2016-06-06 DIAGNOSIS — G5761 Lesion of plantar nerve, right lower limb: Secondary | ICD-10-CM

## 2016-06-06 DIAGNOSIS — G5791 Unspecified mononeuropathy of right lower limb: Secondary | ICD-10-CM

## 2016-06-06 NOTE — Progress Notes (Signed)
She presents today with her son for follow-up of her sinus tarsi neuritis right foot. She states it is starting to improve but is not well yet.  Objective: Vital signs are stable alert and oriented 3. Pulses are palpable. Neurologic sensorium is intact. She has pain on palpation of the sinus tarsi right foot.  Assessment: Neuritis sinus tarsitis right.  Plan: Injected her second dose of dehydrated alcohol today. Follow up with her in 3-4 weeks.

## 2016-06-27 ENCOUNTER — Ambulatory Visit: Payer: Medicare HMO | Admitting: Podiatry

## 2016-07-20 ENCOUNTER — Other Ambulatory Visit: Payer: Self-pay

## 2016-07-20 ENCOUNTER — Emergency Department: Payer: Medicare HMO

## 2016-07-20 ENCOUNTER — Encounter: Payer: Self-pay | Admitting: Intensive Care

## 2016-07-20 ENCOUNTER — Inpatient Hospital Stay
Admission: EM | Admit: 2016-07-20 | Discharge: 2016-07-22 | DRG: 282 | Disposition: A | Discharge status: Home / Self Care | Payer: Medicare HMO | Attending: Internal Medicine | Admitting: Internal Medicine

## 2016-07-20 ENCOUNTER — Other Ambulatory Visit
Admission: RE | Admit: 2016-07-20 | Discharge: 2016-07-20 | Disposition: A | Discharge status: Home / Self Care | Payer: Medicare Other | Source: Ambulatory Visit | Attending: Cardiology | Admitting: Cardiology

## 2016-07-20 DIAGNOSIS — R0789 Other chest pain: Secondary | ICD-10-CM | POA: Insufficient documentation

## 2016-07-20 DIAGNOSIS — R079 Chest pain, unspecified: Secondary | ICD-10-CM

## 2016-07-20 DIAGNOSIS — R778 Other specified abnormalities of plasma proteins: Secondary | ICD-10-CM | POA: Diagnosis present

## 2016-07-20 DIAGNOSIS — I214 Non-ST elevation (NSTEMI) myocardial infarction: Secondary | ICD-10-CM | POA: Diagnosis present

## 2016-07-20 DIAGNOSIS — Z886 Allergy status to analgesic agent status: Secondary | ICD-10-CM | POA: Diagnosis not present

## 2016-07-20 DIAGNOSIS — K219 Gastro-esophageal reflux disease without esophagitis: Secondary | ICD-10-CM | POA: Diagnosis present

## 2016-07-20 DIAGNOSIS — Z66 Do not resuscitate: Secondary | ICD-10-CM | POA: Diagnosis present

## 2016-07-20 DIAGNOSIS — Z7982 Long term (current) use of aspirin: Secondary | ICD-10-CM

## 2016-07-20 DIAGNOSIS — I10 Essential (primary) hypertension: Secondary | ICD-10-CM | POA: Diagnosis present

## 2016-07-20 DIAGNOSIS — R7989 Other specified abnormal findings of blood chemistry: Secondary | ICD-10-CM

## 2016-07-20 LAB — LIPID PANEL
Cholesterol: 204 mg/dL — ABNORMAL HIGH (ref 0–200)
HDL: 72 mg/dL (ref >40)
LDL CALC: 111 mg/dL — AB (ref 0–99)
TRIGLYCERIDES: 103 mg/dL (ref <150)
Total CHOL/HDL Ratio: 2.8 RATIO
VLDL: 21 mg/dL (ref 0–40)

## 2016-07-20 LAB — PROTIME-INR
INR: 0.98
PROTHROMBIN TIME: 13 s (ref 11.4–15.2)

## 2016-07-20 LAB — BASIC METABOLIC PANEL
ANION GAP: 5 (ref 5–15)
BUN: 16 mg/dL (ref 6–20)
CALCIUM: 9.3 mg/dL (ref 8.9–10.3)
CO2: 27 mmol/L (ref 22–32)
Chloride: 106 mmol/L (ref 101–111)
Creatinine, Ser: 0.79 mg/dL (ref 0.44–1.00)
GFR calc non Af Amer: >60 mL/min (ref >60)
GLUCOSE: 100 mg/dL — AB (ref 65–99)
POTASSIUM: 4 mmol/L (ref 3.5–5.1)
Sodium: 138 mmol/L (ref 135–145)

## 2016-07-20 LAB — TROPONIN I
TROPONIN I: 4.36 ng/mL — AB (ref <0.03)
TROPONIN I: 7.32 ng/mL — AB (ref <0.03)
Troponin I: 9.15 ng/mL (ref <0.03)

## 2016-07-20 LAB — APTT: APTT: 30 s (ref 24–36)

## 2016-07-20 LAB — CBC
HEMATOCRIT: 36.2 % (ref 35.0–47.0)
HEMOGLOBIN: 12.1 g/dL (ref 12.0–16.0)
MCH: 31.5 pg (ref 26.0–34.0)
MCHC: 33.4 g/dL (ref 32.0–36.0)
MCV: 94.3 fL (ref 80.0–100.0)
Platelets: 223 10*3/uL (ref 150–440)
RBC: 3.84 MIL/uL (ref 3.80–5.20)
RDW: 13.7 % (ref 11.5–14.5)
WBC: 7.5 10*3/uL (ref 3.6–11.0)

## 2016-07-20 MED ORDER — METOPROLOL TARTRATE 25 MG PO TABS
25.0000 mg | ORAL_TABLET | Freq: Two times a day (BID) | ORAL | Status: DC
  Administered 2016-07-21 (×2): 25 mg via ORAL
  Filled 2016-07-20 (×2): qty 1

## 2016-07-20 MED ORDER — DOCUSATE SODIUM 100 MG PO CAPS
100.0000 mg | ORAL_CAPSULE | Freq: Two times a day (BID) | ORAL | Status: DC | PRN
  Administered 2016-07-20: 100 mg via ORAL
  Filled 2016-07-20: qty 1

## 2016-07-20 MED ORDER — ASPIRIN EC 81 MG PO TBEC
81.0000 mg | DELAYED_RELEASE_TABLET | Freq: Every day | ORAL | Status: DC
  Administered 2016-07-22: 81 mg via ORAL
  Filled 2016-07-20: qty 1

## 2016-07-20 MED ORDER — ATORVASTATIN CALCIUM 20 MG PO TABS
40.0000 mg | ORAL_TABLET | Freq: Every day | ORAL | Status: DC
  Administered 2016-07-20 – 2016-07-21 (×2): 40 mg via ORAL
  Filled 2016-07-20 (×2): qty 2

## 2016-07-20 MED ORDER — HEPARIN (PORCINE) IN NACL 100-0.45 UNIT/ML-% IJ SOLN
850.0000 [IU]/h | INTRAMUSCULAR | Status: DC
  Administered 2016-07-20: 850 [IU]/h via INTRAVENOUS
  Filled 2016-07-20 (×2): qty 250

## 2016-07-20 MED ORDER — HEPARIN BOLUS VIA INFUSION
4000.0000 [IU] | Freq: Once | INTRAVENOUS | Status: AC
  Administered 2016-07-20: 4000 [IU] via INTRAVENOUS
  Filled 2016-07-20: qty 4000

## 2016-07-20 MED ORDER — ACETAMINOPHEN 325 MG PO TABS
650.0000 mg | ORAL_TABLET | Freq: Four times a day (QID) | ORAL | Status: DC | PRN
  Administered 2016-07-20 – 2016-07-21 (×3): 650 mg via ORAL
  Filled 2016-07-20 (×4): qty 2

## 2016-07-20 MED ORDER — NITROGLYCERIN IN D5W 200-5 MCG/ML-% IV SOLN
0.0000 ug/min | INTRAVENOUS | Status: DC
  Administered 2016-07-20: 5 ug/min via INTRAVENOUS

## 2016-07-20 MED ORDER — NITROGLYCERIN IN D5W 200-5 MCG/ML-% IV SOLN
0.0000 ug/min | Freq: Once | INTRAVENOUS | Status: AC
  Administered 2016-07-20: 5 ug/min via INTRAVENOUS
  Filled 2016-07-20: qty 250

## 2016-07-20 NOTE — ED Notes (Signed)
Kendall RN transporting pt to [id number]

## 2016-07-20 NOTE — Progress Notes (Signed)
ANTICOAGULATION CONSULT NOTE - Initial Consult  Pharmacy Consult for Heparin  Indication: chest pain/ACS  Allergies  Allergen Reactions  . Ibuprofen   . Methotrexate Sodium Nausea Only    Had been having side effects (trouble with vision, nervous and digestive systems, nausea, lost around 15 pounds), so stopped taking it in October 2017.   . Nsaids     Other reaction(s): Other (See Comments)  . Risedronate Diarrhea  . Celecoxib Anxiety    Patient Measurements: Height: 5\' 4"  (162.6 cm) Weight: 157 lb 8 oz (71.4 kg) IBW/kg (Calculated) : 54.7 Heparin Dosing Weight: 69.3 kg   Vital Signs: Temp: 97.9 F (36.6 C) (05/23 1607) Temp Source: Oral (05/23 1607) BP: 194/87 (05/23 1630) Pulse Rate: 65 (05/23 1630)  Labs:  Recent Labs  07/20/16 1227 07/20/16 1611  HGB  --  12.1  HCT  --  36.2  PLT  --  223  TROPONINI 4.36*  --     CrCl cannot be calculated (No order found.).   Medical History: Past Medical History:  Diagnosis Date  . Arthritis   . Dysrhythmia   . GERD (gastroesophageal reflux disease)     Medications:  Scheduled:  . heparin  4,000 Units Intravenous Once    Assessment: Pharmacy consulted to dose heparin in this 83 year old female admitted with ACS/NSTEMI.  No prior anticoag noted.  CrCl =   Goal of Therapy:  Heparin level 0.3-0.7 units/ml Monitor platelets by anticoagulation protocol: Yes   Plan:  Give 4000 units bolus x 1 Start heparin infusion at 850 units/hr Check anti-Xa level in 8 hours and daily while on heparin Continue to monitor H&H and platelets  Ladarrius Bogdanski D 07/20/2016,4:39 PM

## 2016-07-20 NOTE — ED Triage Notes (Signed)
Patient states "I saw DR Ubaldo Glassing this morning for chest congestion and they called me back this afternoon saying my troponin levels were up and to go to ER" Patient c/o pain in L neck. A&O x4. Patient ambulated to triage room with no problems. Denies chest pain at this time

## 2016-07-20 NOTE — Consult Note (Signed)
Established Patient Visit   Chief Complaint: Chief Complaint  Patient presents with  . Follow-up  started yesterday has taken extra asa while bp was high  . Hypertension  bp high 170/99------138/76-------this 144 87 pulse 78 feels the heartrate  . Chest Pain  tightness in the chest has it now as well  . Nausea  I still have some  Date of Service: 07/20/2016 Date of Birth: September 13, 1927 PCP: Macie Burows, MD  History of Present Illness: Alicia Lambert is a 83 y.o.female patient who has a history hypertension and hyperlipidemia. She presents for evaluation with complaints of chest tightness which awoke her from sleep. Her sister passed away in hospice care this morning however the symptoms preceded her knowledge of that. Electrocardiogram today revealed sinus rhythm at a rate of 81 bpm with a PR interval of 148 ms, QRS duration 128 ms with a QTC of 483 ms with a QRS axis -45. There is no ischemia. She denies orthopnea or PND. She states the tightness was in the middle of her chest. It occurred all night. It felt like her heart was pounding during the event. Past Medical and Surgical History  Past Medical History Past Medical History:  Diagnosis Date  . Hyperlipidemia, unspecified  . Hypertension  . Tachycardia, unspecified   Past Surgical History She has a past surgical history that includes Hemorrhoidectomy External (1966); Back Surgery (1992); Right Eye Surgery due to trama 2001; and Right Carpal Tunnel Release 2003 Dr Derrel Nip.   Medications and Allergies  Current Medications  Current Outpatient Prescriptions  Medication Sig Dispense Refill  . ACETAMINOPHEN (TYLENOL ARTHRITIS ORAL) Take 1 tablet by mouth as needed. Takes 1 tablet throughout the day as needed and 2 tablets at night as needed.  Marland Kitchen aspirin 81 MG chewable tablet Take 81 mg by mouth once daily.  Marland Kitchen DOCUSATE SODIUM (STOOL SOFTENER ORAL) Take 1 tablet by mouth once daily.  . metoprolol tartrate (LOPRESSOR) 25 MG tablet  Take 1 tablet (25 mg total) by mouth 2 (two) times daily. 60 tablet 11   No current facility-administered medications for this visit.   Allergies: Actonel [risedronate]; Celebrex [celecoxib]; Ibuprofen; and Methotrexate sodium  Social and Family History  Social History reports that she has never smoked. She has never used smokeless tobacco. She reports that she does not drink alcohol or use drugs.  Family History Family History  Problem Relation Age of Onset  . Heart disease Father  . Heart disease Mother   Review of Systems  Review of Systems  Constitutional: Negative for chills, diaphoresis, fever, malaise/fatigue and weight loss.  HENT: Negative for congestion, ear discharge, hearing loss and tinnitus.  Eyes: Negative for blurred vision.  Respiratory: Negative for cough, hemoptysis, sputum production, shortness of breath and wheezing.  Cardiovascular: Negative for chest pain, palpitations, orthopnea, claudication, leg swelling and PND.  Gastrointestinal: Negative for abdominal pain, blood in stool, constipation, diarrhea, heartburn, melena, nausea and vomiting.  Genitourinary: Negative for dysuria, frequency, hematuria and urgency.  Musculoskeletal: Positive for joint pain. Negative for back pain, falls and myalgias.  Skin: Negative for itching and rash.  Neurological: Negative for tingling, focal weakness, loss of consciousness, weakness and headaches.  Endo/Heme/Allergies: Negative for polydipsia. Does not bruise/bleed easily.  Psychiatric/Behavioral: Negative for depression, memory loss and substance abuse. The patient is not nervous/anxious.    Physical Examination   Vitals: BP 162/80  Pulse 68  Resp 12  Ht 163.8 cm (5' 4.5")  Wt 71.5 kg (157 lb 9.6 oz)  BMI  26.63 kg/m  Ht:163.8 cm (5' 4.5") Wt:71.5 kg (157 lb 9.6 oz) OMA:YOKH surface area is 1.8 meters squared. Body mass index is 26.63 kg/m.  General: Well-appearing Caucasian female in no acute  distress  LUNGS Breath Sounds: Normal Percussion: Normal  CARDIOVASCULAR JVP CV wave: no HJR: no Elevation at 90 degrees: None Carotid Pulse: normal pulsation bilaterally Bruit: None Apex: apical impulse normal  Auscultation Rhythm: normal sinus rhythm S1: normal S2: normal Clicks: no Rub: no Murmurs: Are no murmurs  Gallop: None ABDOMEN Liver enlargement: no Pulsatile aorta: no Ascites: no Bruits: no  EXTREMITIES Clubbing: no Edema: trace to 1+ bilateral pedal edema Pulses: peripheral pulses symmetrical Femoral Bruits: no Amputation: no SKIN Rash: no Cyanosis: no Embolic phemonenon: no Bruising: no NEURO Alert and Oriented: yes Non focal: yes  Assessment   83 y.o. female with  1. HTN (hypertension)- This is likely secondary to her discomfort. Will place on metoprolol at low dose and follow for blood pressure improvement as well as heart rate control  2. Other and unspecified hyperlipidemia  Well controlled with diet. Continue with low-fat low-cholesterol diet and daily exercise.  3. Chest pain-as per below. We will proceed with a functional study to evaluate for ischemia  4. Palpitations-Holter monitor and metoprolol  5. Shortness of breath-we will proceed with an echocardiogram.  Plan  1. Chest pain is of unclear etiology. May be arrhythmia induced. Will check a serum troponin to determine if there is any increase. If so would proceed with urgent invasive workup. We will also check basic metabolic profile and CBC. We will proceed with a functional study to evaluate for evidence of exercise-induced ischemia. Also place a Holter monitor determine if arrhythmia is abnormal. Will add metoprolol tartrate 25 mg twice daily to her regimen. Serum, troponin was elevated to 4.36. Pt call and sent to the er for direct admission for iv heparin, continued metoprolol. Will add asa, high intensity statin and schedule for cardiac cath in the am.     These notes  generated with voice recognition software. I apologize for typographical errors.  Sydnee Levans, MD

## 2016-07-20 NOTE — H&P (Signed)
[facility] [place] NAME: Alicia Lambert    MR#:  [id number]  DATE OF BIRTH:  September 22, 1927  DATE OF ADMISSION:  07/20/2016  PRIMARY CARE PHYSICIAN: Maryland Pink, MD   REQUESTING/REFERRING PHYSICIAN: Darl Householder  CHIEF COMPLAINT:   Chief Complaint  Patient presents with  . Abnormal Lab    HISTORY OF PRESENT ILLNESS: Alicia Lambert  is a 83 y.o. female with a known history of Arthritis, gastroesophageal reflux disease- had complain of tightness in her chest last night it lasted for a few hours so today morning they called Dr. Ubaldo Glassing office and she was given an appointment in the noontime to come and see him. Dr. Nyra Jabs and troponin from the office, EKG was normal. After that advise her to go home. Within 2 hours they got the results back off troponin more than 4, so she received a call from doctor's office to go to emergency room right away. Currently in ER her pain in the chest is not much but she started having pain in left side of her neck and her jaw. ER physician spoke to cardiologist and he suggested to start on heparin drip and nitroglycerin drip. Plan is for catheterization tomorrow.  PAST MEDICAL HISTORY:   Past Medical History:  Diagnosis Date  . Arthritis   . Dysrhythmia   . GERD (gastroesophageal reflux disease)     PAST SURGICAL HISTORY: Past Surgical History:  Procedure Laterality Date  . BACK SURGERY    . CATARACT EXTRACTION W/PHACO Left 01/26/2015   Procedure: CATARACT EXTRACTION PHACO AND INTRAOCULAR LENS PLACEMENT (IOC);  Surgeon: Estill Cotta, MD;  Location: [facility] ORS;  Service: Ophthalmology;  Laterality: Left;  [place] 01:49 AP% 24.2 CDE 45.30 fluid pack lot # [id number]  . CORONARY ANGIOPLASTY    . CTR    . EYE SURGERY      SOCIAL HISTORY:  Social History  Substance Use Topics  . Smoking status: Never Smoker  . Smokeless tobacco: Never Used  . Alcohol use No    FAMILY HISTORY:  Family History  Problem Relation Age of  Onset  . CAD Father   . CAD Son     DRUG ALLERGIES:  Allergies  Allergen Reactions  . Ibuprofen   . Methotrexate Sodium Nausea Only    Had been having side effects (trouble with vision, nervous and digestive systems, nausea, lost around 15 pounds), so stopped taking it in October 2017.   . Nsaids     Other reaction(s): Other (See Comments)  . Risedronate Diarrhea  . Celecoxib Anxiety    REVIEW OF SYSTEMS:   CONSTITUTIONAL: No fever, fatigue or weakness.  EYES: No blurred or double vision.  EARS, NOSE, AND THROAT: No tinnitus or ear pain.  RESPIRATORY: No cough, shortness of breath, wheezing or hemoptysis.  CARDIOVASCULAR: Positive for chest pain, no orthopnea, edema.  GASTROINTESTINAL: No nausea, vomiting, diarrhea or abdominal pain.  GENITOURINARY: No dysuria, hematuria.  ENDOCRINE: No polyuria, nocturia,  HEMATOLOGY: No anemia, easy bruising or bleeding SKIN: No rash or lesion. MUSCULOSKELETAL: No joint pain or arthritis.   NEUROLOGIC: No tingling, numbness, weakness.  PSYCHIATRY: No anxiety or depression.   MEDICATIONS AT HOME:  Prior to Admission medications   Medication Sig Start Date End Date Taking? Authorizing Provider  acetaminophen (TYLENOL) 650 MG CR tablet Take 650 mg by mouth every 8 (eight) hours as needed for pain.   Yes [provider]  aspirin 81 MG tablet Take 81 mg by mouth daily.  Yes [provider]  loratadine (CLARITIN) 10 MG tablet Take 10 mg by mouth daily as needed.    Yes [provider]      PHYSICAL EXAMINATION:   VITAL SIGNS: Blood pressure (!) 149/86, pulse 62, temperature 97.9 F (36.6 C), temperature source Oral, resp. rate (!) 24, height 5\' 4"  (1.626 m), weight 71.4 kg (157 lb 8 oz), SpO2 97 %.  GENERAL:  83 y.o.-year-old patient lying in the bed with no acute distress.  EYES: Pupils equal, round, reactive to light and accommodation. No scleral icterus. Extraocular muscles intact.  HEENT: Head atraumatic,  normocephalic. Oropharynx and nasopharynx clear.  NECK:  Supple, no jugular venous distention. No thyroid enlargement, no tenderness.  LUNGS: Normal breath sounds bilaterally, no wheezing, rales,rhonchi or crepitation. No use of accessory muscles of respiration.  CARDIOVASCULAR: S1, S2 normal. No murmurs, rubs, or gallops.  ABDOMEN: Soft, nontender, nondistended. Bowel sounds present. No organomegaly or mass.  EXTREMITIES: No pedal edema, cyanosis, or clubbing.  NEUROLOGIC: Cranial nerves II through XII are intact. Muscle strength 5/5 in all extremities. Sensation intact. Gait not checked.  PSYCHIATRIC: The patient is alert and oriented x 3.  SKIN: No obvious rash, lesion, or ulcer.   LABORATORY PANEL:   CBC  Recent Labs Lab 07/20/16 1611  WBC 7.5  HGB 12.1  HCT 36.2  PLT 223  MCV 94.3  MCH 31.5  MCHC 33.4  RDW 13.7   ------------------------------------------------------------------------------------------------------------------  Chemistries   Recent Labs Lab 07/20/16 1611  NA 138  K 4.0  CL 106  CO2 27  GLUCOSE 100*  BUN 16  CREATININE 0.79  CALCIUM 9.3   ------------------------------------------------------------------------------------------------------------------ estimated creatinine clearance is 48.9 mL/min (by C-G formula based on SCr of 0.79 mg/dL). ------------------------------------------------------------------------------------------------------------------ No results for input(s): TSH, T4TOTAL, T3FREE, THYROIDAB in the last 72 hours.  Invalid input(s): FREET3   Coagulation profile  Recent Labs Lab 07/20/16 1616  INR 0.98   ------------------------------------------------------------------------------------------------------------------- No results for input(s): DDIMER in the last 72 hours. -------------------------------------------------------------------------------------------------------------------  Cardiac Enzymes  Recent Labs Lab  07/20/16 1227 07/20/16 1611  TROPONINI 4.36* 7.32*   ------------------------------------------------------------------------------------------------------------------ Invalid input(s): POCBNP  ---------------------------------------------------------------------------------------------------------------  Urinalysis No results found for: COLORURINE, APPEARANCEUR, LABSPEC, PHURINE, GLUCOSEU, HGBUR, BILIRUBINUR, KETONESUR, PROTEINUR, UROBILINOGEN, NITRITE, LEUKOCYTESUR   RADIOLOGY: Dg Chest Port 1 View  Result Date: 07/20/2016 CLINICAL DATA:  Chest pain EXAM: PORTABLE CHEST 1 VIEW COMPARISON:  None. FINDINGS: Lungs are essentially clear. Mild lingular scarring. No pleural effusion or pneumothorax. The heart is normal in size. IMPRESSION: No evidence of acute cardiopulmonary disease. Electronically Signed   By: Julian Hy M.D.   On: 07/20/2016 17:03    EKG: Orders placed or performed during the hospital encounter of 07/20/16  . ED EKG within 10 minutes  . ED EKG within 10 minutes   Normal sinus rhythm.  IMPRESSION AND PLAN:  * Non-ST elevation MI   Aspirin, metoprolol, atorvastatin.   Heparin and nitroglycerin IV drip.   Monitor on telemetry and follow serial troponin.   Catheterization is planned tomorrow by Dr. Ubaldo Glassing.  * Hypertension   Metoprolol.  I discussed with her son and his friend were present in the room during my visit.  Patient said she would like her son to be her power of attorney for healthcare decisions, and in any adverse event by cardiac arrest, arrhythmia, respiratory distress- she would like to be DO NOT RESUSCITATE.  I informed her about possibilities of having cardiac arrhythmia in post MI period, she understands and would  like to be DO NOT RESUSCITATE if that happens.  All the records are reviewed and case discussed with ED provider. Management plans discussed with the patient, family and they are in agreement.  CODE STATUS: DNR Code Status  History    This patient does not have a recorded code status. Please follow your organizational policy for patients in this situation.     Patient's son was present in the room during my visit.  TOTAL TIME TAKING CARE OF THIS PATIENT: 50 critical care minutes.    Vaughan Basta M.D on 07/20/2016   Between 7am to 6pm - Pager - [phone]  After 6pm go to [email or web address] - password [email or web address] [facility]  Office  [phone]  CC: Primary care physician; Maryland Pink, MD   Note: This dictation was prepared with Dragon dictation along with smaller phrase technology. Any transcriptional errors that result from this process are unintentional.

## 2016-07-20 NOTE — ED Notes (Signed)
Assisted pt to toilet. Pt ambulated with no difficulty. Pt given cup of water.

## 2016-07-20 NOTE — ED Notes (Signed)
Assisted pt to toilet. Pt ambulated with no difficultly.

## 2016-07-20 NOTE — ED Notes (Signed)
ED Provider at bedside. 

## 2016-07-20 NOTE — ED Provider Notes (Signed)
[facility] Provider Note   CSN: [id number] Arrival date & time: 07/20/16  1603     History   Chief Complaint Chief Complaint  Patient presents with  . Abnormal Lab    HPI Alicia Lambert is a 83 y.o. female hx of arthritis, GERD, here with chest pain. Patient states that she has substernal chest pressure radiating to the jaw and arms since last night. It is associated with some shortness of breath and she had trouble sleeping due to the pain. She went to see Dr. Ubaldo Glassing from cardiology this morning. She was scheduled for outpatient stress test and had a troponin drawn outpatient that was 4.3. Patient was called to come to the ED. States that she still has 5/10 chest pressure. Denies previous cardiac stents in the past. Dr. Ubaldo Glassing called ahead and recommend IV heparin and he will cath in AM.    The history is provided by the patient.    Past Medical History:  Diagnosis Date  . Arthritis   . Dysrhythmia   . GERD (gastroesophageal reflux disease)     There are no active problems to display for this patient.   Past Surgical History:  Procedure Laterality Date  . BACK SURGERY    . CATARACT EXTRACTION W/PHACO Left 01/26/2015   Procedure: CATARACT EXTRACTION PHACO AND INTRAOCULAR LENS PLACEMENT (IOC);  Surgeon: Estill Cotta, MD;  Location: [facility] ORS;  Service: Ophthalmology;  Laterality: Left;  [place] 01:49 AP% 24.2 CDE 45.30 fluid pack lot # [id number]  . CORONARY ANGIOPLASTY    . CTR    . EYE SURGERY      OB History    No data available       Home Medications    Prior to Admission medications   Medication Sig Start Date End Date Taking? Authorizing Provider  aspirin 81 MG tablet Take 81 mg by mouth daily.    [provider]  loratadine (CLARITIN) 10 MG tablet Take 10 mg by mouth.    [provider]    Family History History reviewed. No pertinent family history.  Social History Social History  Substance Use Topics  . Smoking  status: Never Smoker  . Smokeless tobacco: Never Used  . Alcohol use No     Allergies   Ibuprofen; Methotrexate sodium; Nsaids; Risedronate; and Celecoxib   Review of Systems Review of Systems  Cardiovascular: Positive for chest pain.  All other systems reviewed and are negative.    Physical Exam Updated Vital Signs BP 117/85   Pulse 65   Temp 97.9 F (36.6 C) (Oral)   Resp 13   Ht 5\' 4"  (1.626 m)   Wt 71.4 kg (157 lb 8 oz)   SpO2 99%   BMI 27.03 kg/m   Physical Exam  Constitutional: She is oriented to person, place, and time.  Slightly uncomfortable   HENT:  Head: Normocephalic.  Mouth/Throat: Oropharynx is clear and moist.  Eyes: EOM are normal. Pupils are equal, round, and reactive to light.  Neck: Normal range of motion. Neck supple.  Cardiovascular: Normal rate, regular rhythm and normal heart sounds.   Pulmonary/Chest: Effort normal and breath sounds normal. No respiratory distress. She has no wheezes.  Abdominal: Soft. Bowel sounds are normal. She exhibits no distension. There is no tenderness.  Musculoskeletal: Normal range of motion.  Neurological: She is alert and oriented to person, place, and time.  Skin: Skin is warm.  Psychiatric: She has a normal mood and affect.  Nursing note and vitals  reviewed.    ED Treatments / Results  Labs (all labs ordered are listed, but only abnormal results are displayed) Labs Reviewed  BASIC METABOLIC PANEL - Abnormal; Notable for the following:       Result Value   Glucose, Bld 100 (*)    All other components within normal limits  TROPONIN I - Abnormal; Notable for the following:    Troponin I 7.32 (*)    All other components within normal limits  CBC  APTT  PROTIME-INR  CBC    EKG  EKG Interpretation None      ED ECG REPORT I, Wandra Arthurs, the attending physician, personally viewed and interpreted this ECG.   Date: 07/20/2016  EKG Time: 16:08 pm  Rate: 68  Rhythm: normal EKG, normal sinus  rhythm  Axis: left  Intervals:none  ST&T Change: nonspecific    Radiology Dg Chest Port 1 View  Result Date: 07/20/2016 CLINICAL DATA:  Chest pain EXAM: PORTABLE CHEST 1 VIEW COMPARISON:  None. FINDINGS: Lungs are essentially clear. Mild lingular scarring. No pleural effusion or pneumothorax. The heart is normal in size. IMPRESSION: No evidence of acute cardiopulmonary disease. Electronically Signed   By: Julian Hy M.D.   On: 07/20/2016 17:03    Procedures Procedures (including critical care time)  Medications Ordered in ED Medications  heparin ADULT infusion 100 units/mL (25000 units/268mL sodium chloride 0.45%) (not administered)  heparin bolus via infusion 4,000 Units (not administered)  nitroGLYCERIN 50 mg in dextrose 5 % 250 mL (0.2 mg/mL) infusion (not administered)     Initial Impression / Assessment and Plan / ED Course  I have reviewed the triage vital signs and the nursing notes.  Pertinent labs & imaging results that were available during my care of the patient were reviewed by me and considered in my medical decision making (see chart for details).     Alicia Lambert is a 83 y.o. female here with chest pain, positive trop. Still has 5/10 pain. Hypertensive in the ED. Concerned for NSTEMI. Repeat EKG showed no STEMI. Repeat trop now 7. Will start heparin drip, nitro drip. She took 6 ASA pills at home already today.     Final Clinical Impressions(s) / ED Diagnoses   Final diagnoses:  Chest pain    New Prescriptions New Prescriptions   No medications on file     Drenda Freeze, MD 07/20/16 1710

## 2016-07-20 NOTE — ED Notes (Signed)
Admitting MD at bedside.

## 2016-07-21 ENCOUNTER — Encounter: Payer: Self-pay | Admitting: *Deleted

## 2016-07-21 ENCOUNTER — Ambulatory Visit: Admission: RE | Admit: 2016-07-21 | Payer: Medicare HMO | Source: Ambulatory Visit | Admitting: Internal Medicine

## 2016-07-21 ENCOUNTER — Encounter
Admission: EM | Disposition: A | Discharge status: Home / Self Care | Payer: Self-pay | Source: Home / Self Care | Attending: Internal Medicine

## 2016-07-21 DIAGNOSIS — I2510 Atherosclerotic heart disease of native coronary artery without angina pectoris: Secondary | ICD-10-CM

## 2016-07-21 HISTORY — DX: Atherosclerotic heart disease of native coronary artery without angina pectoris: I25.10

## 2016-07-21 HISTORY — PX: LEFT HEART CATH AND CORONARY ANGIOGRAPHY: CATH118249

## 2016-07-21 LAB — CBC
HEMATOCRIT: 35 % (ref 35.0–47.0)
HEMOGLOBIN: 11.5 g/dL — AB (ref 12.0–16.0)
MCH: 31.4 pg (ref 26.0–34.0)
MCHC: 32.9 g/dL (ref 32.0–36.0)
MCV: 95.5 fL (ref 80.0–100.0)
Platelets: 217 10*3/uL (ref 150–440)
RBC: 3.67 MIL/uL — ABNORMAL LOW (ref 3.80–5.20)
RDW: 13.7 % (ref 11.5–14.5)
WBC: 5.8 10*3/uL (ref 3.6–11.0)

## 2016-07-21 LAB — HEPARIN LEVEL (UNFRACTIONATED)
HEPARIN UNFRACTIONATED: 0.39 [IU]/mL (ref 0.30–0.70)
Heparin Unfractionated: 0.34 IU/mL (ref 0.30–0.70)

## 2016-07-21 LAB — BASIC METABOLIC PANEL
ANION GAP: 4 — AB (ref 5–15)
BUN: 15 mg/dL (ref 6–20)
CALCIUM: 9.1 mg/dL (ref 8.9–10.3)
CO2: 28 mmol/L (ref 22–32)
Chloride: 108 mmol/L (ref 101–111)
Creatinine, Ser: 0.84 mg/dL (ref 0.44–1.00)
Glucose, Bld: 95 mg/dL (ref 65–99)
POTASSIUM: 4 mmol/L (ref 3.5–5.1)
Sodium: 140 mmol/L (ref 135–145)

## 2016-07-21 LAB — CARDIAC CATHETERIZATION: CATHEFQUANT: 55 %

## 2016-07-21 LAB — HEMOGLOBIN A1C
Hgb A1c MFr Bld: 5.7 % — ABNORMAL HIGH (ref 4.8–5.6)
MEAN PLASMA GLUCOSE: 117 mg/dL

## 2016-07-21 SURGERY — LEFT HEART CATH AND CORONARY ANGIOGRAPHY
Anesthesia: Moderate Sedation

## 2016-07-21 MED ORDER — ASPIRIN 81 MG PO CHEW: 81.0000 mg | CHEWABLE_TABLET | ORAL | Status: DC

## 2016-07-21 MED ORDER — SODIUM CHLORIDE 0.9 % WEIGHT BASED INFUSION: 1.0000 mL/kg/h | INTRAVENOUS | Status: DC

## 2016-07-21 MED ORDER — BISACODYL 5 MG PO TBEC: 5.0000 mg | DELAYED_RELEASE_TABLET | Freq: Every day | ORAL | Status: DC | PRN

## 2016-07-21 MED ORDER — SODIUM CHLORIDE 0.9% FLUSH
3.0000 mL | Freq: Two times a day (BID) | INTRAVENOUS | Status: DC
  Administered 2016-07-22: 3 mL via INTRAVENOUS

## 2016-07-21 MED ORDER — SODIUM CHLORIDE 0.9% FLUSH: 3.0000 mL | INTRAVENOUS | Status: DC | PRN

## 2016-07-21 MED ORDER — SODIUM CHLORIDE 0.9% FLUSH
3.0000 mL | Freq: Two times a day (BID) | INTRAVENOUS | Status: DC
  Administered 2016-07-21: 3 mL via INTRAVENOUS

## 2016-07-21 MED ORDER — ONDANSETRON HCL 4 MG/2ML IJ SOLN: 4.0000 mg | Freq: Four times a day (QID) | INTRAMUSCULAR | Status: DC | PRN

## 2016-07-21 MED ORDER — ASPIRIN 81 MG PO CHEW
81.0000 mg | CHEWABLE_TABLET | ORAL | Status: AC
  Administered 2016-07-21: 81 mg via ORAL
  Filled 2016-07-21: qty 1

## 2016-07-21 MED ORDER — IOPAMIDOL (ISOVUE-300) INJECTION 61%
INTRAVENOUS | Status: DC | PRN
  Administered 2016-07-21: 80 mL via INTRA_ARTERIAL

## 2016-07-21 MED ORDER — FENTANYL CITRATE (PF) 100 MCG/2ML IJ SOLN
INTRAMUSCULAR | Status: DC | PRN
  Administered 2016-07-21: 25 ug via INTRAVENOUS

## 2016-07-21 MED ORDER — SODIUM CHLORIDE 0.9 % IV SOLN: 250.0000 mL | INTRAVENOUS | Status: DC | PRN

## 2016-07-21 MED ORDER — ACETAMINOPHEN 325 MG PO TABS
650.0000 mg | ORAL_TABLET | ORAL | Status: DC | PRN
  Administered 2016-07-21: 650 mg via ORAL

## 2016-07-21 MED ORDER — SODIUM CHLORIDE 0.9 % WEIGHT BASED INFUSION
1.0000 mL/kg/h | INTRAVENOUS | Status: DC
  Administered 2016-07-21: 1 mL/kg/h via INTRAVENOUS

## 2016-07-21 MED ORDER — SODIUM CHLORIDE 0.9 % WEIGHT BASED INFUSION
3.0000 mL/kg/h | INTRAVENOUS | Status: DC
Start: 2016-07-22 — End: 2016-07-21

## 2016-07-21 MED ORDER — MIDAZOLAM HCL 2 MG/2ML IJ SOLN
INTRAMUSCULAR | Status: DC | PRN
  Administered 2016-07-21: 1 mg via INTRAVENOUS

## 2016-07-21 SURGICAL SUPPLY — 9 items
CATH INFINITI 5FR ANG PIGTAIL (CATHETERS) ×3 IMPLANT
CATH INFINITI 5FR JL4 (CATHETERS) ×6 IMPLANT
CATH INFINITI JR4 5F (CATHETERS) ×3 IMPLANT
DEVICE CLOSURE MYNXGRIP 5F (Vascular Products) ×3 IMPLANT
GUIDEWIRE 3MM J TIP .035 145 (WIRE) ×3 IMPLANT
KIT MANI 3VAL PERCEP (MISCELLANEOUS) ×3 IMPLANT
NEEDLE PERC 18GX7CM (NEEDLE) ×3 IMPLANT
PACK CARDIAC CATH (CUSTOM PROCEDURE TRAY) ×3 IMPLANT
SHEATH PINNACLE 5F 10CM (SHEATH) ×3 IMPLANT

## 2016-07-21 NOTE — Progress Notes (Addendum)
[facility] [place] NAME: Alicia Lambert    MR#:  [id number]  DATE OF BIRTH:  November 26, 1927  SUBJECTIVE:  CHIEF COMPLAINT:   Chief Complaint  Patient presents with  . Abnormal Lab   Still has chest tightness and nausea, troponin is up to 9.15 while on heparin drip. REVIEW OF SYSTEMS:  Review of Systems  Constitutional: Positive for malaise/fatigue. Negative for chills and fever.  HENT: Negative for nosebleeds.   Eyes: Negative for blurred vision and double vision.  Respiratory: Negative for cough, shortness of breath, wheezing and stridor.   Cardiovascular: Positive for chest pain. Negative for palpitations and leg swelling.  Gastrointestinal: Positive for constipation and nausea. Negative for abdominal pain, blood in stool, diarrhea, melena and vomiting.  Genitourinary: Negative for dysuria and hematuria.  Musculoskeletal: Negative for joint pain.  Skin: Negative for itching and rash.  Neurological: Positive for weakness. Negative for dizziness, focal weakness, loss of consciousness and headaches.  Psychiatric/Behavioral: Negative for depression. The patient is not nervous/anxious.     DRUG ALLERGIES:   Allergies  Allergen Reactions  . Ibuprofen   . Methotrexate Sodium Nausea Only    Had been having side effects (trouble with vision, nervous and digestive systems, nausea, lost around 15 pounds), so stopped taking it in October 2017.   . Nsaids     Other reaction(s): Other (See Comments)  . Risedronate Diarrhea  . Celecoxib Anxiety   VITALS:  Blood pressure (!) 150/67, pulse (!) 59, temperature 97.8 F (36.6 C), temperature source Oral, resp. rate 18, height 5\' 4"  (1.626 m), weight 145 lb 4.8 oz (65.9 kg), SpO2 97 %. PHYSICAL EXAMINATION:  Physical Exam  Constitutional: She is oriented to person, place, and time and well-developed, well-nourished, and in no distress.  HENT:  Head: Normocephalic.  Eyes: Conjunctivae and EOM  are normal. No scleral icterus.  Neck: Normal range of motion. Neck supple. No JVD present. No tracheal deviation present.  Cardiovascular: Normal rate, regular rhythm and normal heart sounds.  Exam reveals no gallop.   No murmur heard. Pulmonary/Chest: Effort normal and breath sounds normal. No respiratory distress. She has no wheezes. She has no rales.  Abdominal: Soft. Bowel sounds are normal. She exhibits no distension. There is no tenderness.  Musculoskeletal: Normal range of motion. She exhibits no edema or tenderness.  Neurological: She is alert and oriented to person, place, and time. No cranial nerve deficit.  Skin: No rash noted. No erythema.  Psychiatric: Affect normal.   LABORATORY PANEL:  Female CBC  Recent Labs Lab 07/21/16 0118  WBC 5.8  HGB 11.5*  HCT 35.0  PLT 217   ------------------------------------------------------------------------------------------------------------------ Chemistries   Recent Labs Lab 07/21/16 0118  NA 140  K 4.0  CL 108  CO2 28  GLUCOSE 95  BUN 15  CREATININE 0.84  CALCIUM 9.1   RADIOLOGY:  Dg Chest Port 1 View  Result Date: 07/20/2016 CLINICAL DATA:  Chest pain EXAM: PORTABLE CHEST 1 VIEW COMPARISON:  None. FINDINGS: Lungs are essentially clear. Mild lingular scarring. No pleural effusion or pneumothorax. The heart is normal in size. IMPRESSION: No evidence of acute cardiopulmonary disease. Electronically Signed   By: Julian Hy M.D.   On: 07/20/2016 17:03   ASSESSMENT AND PLAN:   * Non-ST elevation MI Continue Heparin drip, nitroglycerin IV drip,  Aspirin, metoprolol, atorvastatin. Catheterization is planned today.  * Hypertension   Metoprolol.  All the records are reviewed and case discussed with Care  Management/Social Worker. Management plans discussed with the patient, her son and they are in agreement.  CODE STATUS: DNR  TOTAL TIME TAKING CARE OF THIS PATIENT: 36 minutes.   More than 50% of the time was  spent in counseling/coordination of care: YES  POSSIBLE D/C IN 2 DAYS, DEPENDING ON CLINICAL CONDITION.   Demetrios Loll M.D on 07/21/2016 at 12:07 PM  Between 7am to 6pm - Pager - [phone]  After 6pm go to [email or web address] - Proofreader  [facility]  Office  [phone]  CC: Primary care physician; Maryland Pink, MD  Note: This dictation was prepared with Dragon dictation along with smaller phrase technology. Any transcriptional errors that result from this process are unintentional.

## 2016-07-21 NOTE — Progress Notes (Signed)
ANTICOAGULATION CONSULT NOTE - Initial Consult  Pharmacy Consult for Heparin  Indication: chest pain/ACS  Allergies  Allergen Reactions  . Ibuprofen   . Methotrexate Sodium Nausea Only    Had been having side effects (trouble with vision, nervous and digestive systems, nausea, lost around 15 pounds), so stopped taking it in October 2017.   . Nsaids     Other reaction(s): Other (See Comments)  . Risedronate Diarrhea  . Celecoxib Anxiety    Patient Measurements: Height: 5\' 4"  (162.6 cm) Weight: 157 lb 8 oz (71.4 kg) IBW/kg (Calculated) : 54.7 Heparin Dosing Weight: 69.3 kg   Vital Signs: Temp: 97.8 F (36.6 C) (05/23 2009) Temp Source: Oral (05/23 2009) BP: 95/52 (05/23 2358) Pulse Rate: 55 (05/23 2358)  Labs:  Recent Labs  07/20/16 1227 07/20/16 1611 07/20/16 1616 07/20/16 2020 07/21/16 0118  HGB  --  12.1  --   --  11.5*  HCT  --  36.2  --   --  35.0  PLT  --  223  --   --  217  APTT  --   --  30  --   --   LABPROT  --   --  13.0  --   --   INR  --   --  0.98  --   --   HEPARINUNFRC  --   --   --   --  0.39  CREATININE  --  0.79  --   --   --   TROPONINI 4.36* 7.32*  --  9.15*  --     Estimated Creatinine Clearance: 48.9 mL/min (by C-G formula based on SCr of 0.79 mg/dL).   Medical History: Past Medical History:  Diagnosis Date  . Arthritis   . Dysrhythmia   . GERD (gastroesophageal reflux disease)     Medications:  Scheduled:  . aspirin EC  81 mg Oral Daily  . atorvastatin  40 mg Oral q1800  . metoprolol tartrate  25 mg Oral BID    Assessment: Pharmacy consulted to dose heparin in this 83 year old female admitted with ACS/NSTEMI.  No prior anticoag noted.  CrCl =   Goal of Therapy:  Heparin level 0.3-0.7 units/ml Monitor platelets by anticoagulation protocol: Yes   Plan:  Give 4000 units bolus x 1 Start heparin infusion at 850 units/hr Check anti-Xa level in 8 hours and daily while on heparin Continue to monitor H&H and platelets    5/24 @ 0118 HL 0.39 therapeutic. Will continue current rate and ill redraw next HL @ 0900.  Tobie Lords, PharmD, BCPS Clinical Pharmacist 07/21/2016

## 2016-07-21 NOTE — Progress Notes (Signed)
ANTICOAGULATION CONSULT NOTE - FOLLOW UP Consult  Pharmacy Consult for Heparin  Indication: chest pain/ACS  Allergies  Allergen Reactions  . Ibuprofen   . Methotrexate Sodium Nausea Only    Had been having side effects (trouble with vision, nervous and digestive systems, nausea, lost around 15 pounds), so stopped taking it in October 2017.   . Nsaids     Other reaction(s): Other (See Comments)  . Risedronate Diarrhea  . Celecoxib Anxiety    Patient Measurements: Height: 5\' 4"  (162.6 cm) Weight: 145 lb 4.8 oz (65.9 kg) IBW/kg (Calculated) : 54.7 Heparin Dosing Weight: 69.3 kg   Vital Signs: Temp: 97.8 F (36.6 C) (05/24 1212) Temp Source: Oral (05/24 1212) BP: 140/61 (05/24 1400) Pulse Rate: 50 (05/24 1400)  Labs:  Recent Labs  07/20/16 1227 07/20/16 1611 07/20/16 1616 07/20/16 2020 07/21/16 0118 07/21/16 0857  HGB  --  12.1  --   --  11.5*  --   HCT  --  36.2  --   --  35.0  --   PLT  --  223  --   --  217  --   APTT  --   --  30  --   --   --   LABPROT  --   --  13.0  --   --   --   INR  --   --  0.98  --   --   --   HEPARINUNFRC  --   --   --   --  0.39 0.34  CREATININE  --  0.79  --   --  0.84  --   TROPONINI 4.36* 7.32*  --  9.15*  --   --     Estimated Creatinine Clearance: 44.9 mL/min (by C-G formula based on SCr of 0.84 mg/dL).   Medical History: Past Medical History:  Diagnosis Date  . Arthritis   . Dysrhythmia   . GERD (gastroesophageal reflux disease)     Medications:  Scheduled:  . aspirin EC  81 mg Oral Daily  . atorvastatin  40 mg Oral q1800  . metoprolol tartrate  25 mg Oral BID  . sodium chloride flush  3 mL Intravenous Q12H    Assessment: Pharmacy consulted to dose heparin in this 83 year old female admitted with ACS/NSTEMI.  No prior anticoag noted.  CrCl =   Goal of Therapy:  Heparin level 0.3-0.7 units/ml Monitor platelets by anticoagulation protocol: Yes   Plan:  Give 4000 units bolus x 1 Start heparin infusion at 850  units/hr Check anti-Xa level in 8 hours and daily while on heparin Continue to monitor H&H and platelets   5/24 @ 0118 HL 0.39 therapeutic. Will continue current rate and ill redraw next HL @ 0900. 5/24: Heparin level @ 0.34, which is therapeutic. Will continue current rate and will redraw HL with am labs.   Larene Beach, PharmD  Clinical Pharmacist 07/21/2016

## 2016-07-22 ENCOUNTER — Inpatient Hospital Stay: Payer: Medicare HMO

## 2016-07-22 ENCOUNTER — Encounter: Payer: Self-pay | Admitting: Internal Medicine

## 2016-07-22 DIAGNOSIS — R778 Other specified abnormalities of plasma proteins: Secondary | ICD-10-CM | POA: Diagnosis present

## 2016-07-22 DIAGNOSIS — R7989 Other specified abnormal findings of blood chemistry: Secondary | ICD-10-CM | POA: Diagnosis present

## 2016-07-22 LAB — CBC
HEMATOCRIT: 34.2 % — AB (ref 35.0–47.0)
Hemoglobin: 11.4 g/dL — ABNORMAL LOW (ref 12.0–16.0)
MCH: 31.6 pg (ref 26.0–34.0)
MCHC: 33.4 g/dL (ref 32.0–36.0)
MCV: 94.6 fL (ref 80.0–100.0)
Platelets: 198 10*3/uL (ref 150–440)
RBC: 3.61 MIL/uL — ABNORMAL LOW (ref 3.80–5.20)
RDW: 13.7 % (ref 11.5–14.5)
WBC: 5.2 10*3/uL (ref 3.6–11.0)

## 2016-07-22 LAB — HEPARIN LEVEL (UNFRACTIONATED)

## 2016-07-22 MED ORDER — HEPARIN BOLUS VIA INFUSION
2000.0000 [IU] | Freq: Once | INTRAVENOUS | Status: DC
  Filled 2016-07-22: qty 2000

## 2016-07-22 MED ORDER — IOPAMIDOL (ISOVUE-370) INJECTION 76%
75.0000 mL | Freq: Once | INTRAVENOUS | Status: AC | PRN
  Administered 2016-07-22: 75 mL via INTRAVENOUS

## 2016-07-22 MED ORDER — ATORVASTATIN CALCIUM 40 MG PO TABS: 40.0000 mg | ORAL_TABLET | Freq: Every day | ORAL | 0 refills | Status: DC

## 2016-07-22 MED ORDER — HEPARIN BOLUS VIA INFUSION
3500.0000 [IU] | Freq: Once | INTRAVENOUS | Status: AC
  Administered 2016-07-22: 3500 [IU] via INTRAVENOUS
  Filled 2016-07-22: qty 3500

## 2016-07-22 NOTE — Progress Notes (Signed)
Spoke with dr. Bridgett Larsson to make aware of ct angio results. Per md discontinue heparin and discharge patient home

## 2016-07-22 NOTE — Discharge Summary (Signed)
[facility] NAME: Alicia Lambert    MR#:  [id number]  DATE OF BIRTH:  Feb 22, 1928  DATE OF ADMISSION:  07/20/2016   ADMITTING PHYSICIAN: Vaughan Basta, MD  DATE OF DISCHARGE: 07/22/2016  2:06 PM  PRIMARY CARE PHYSICIAN: Maryland Pink, MD   ADMISSION DIAGNOSIS:  Chest pain [R07.9] DISCHARGE DIAGNOSIS:  Active Problems:   Elevated troponin Non-ST elevation MI SECONDARY DIAGNOSIS:   Past Medical History:  Diagnosis Date  . Arthritis   . Dysrhythmia   . GERD (gastroesophageal reflux disease)    HOSPITAL COURSE:   * Non-ST elevation MI She was treated with Heparin drip, nitroglycerin IV drip, Aspirin, metoprolol, atorvastatin. Catheterization: no evidence of significant cad with normal lv funciton and no regional wall motion abnormality. 25% lcx stenosis. LAD and RCA normal. CT chest angiogram showed no PE.  * Hypertension Metoprolol was discontinued due to bradycardia per Dr. Ubaldo Glassing. I discussed with Dr. Ubaldo Glassing. DISCHARGE CONDITIONS:  Stable, discharged home today. CONSULTS OBTAINED:  Treatment Team:  Teodoro Spray, MD DRUG ALLERGIES:   Allergies  Allergen Reactions  . Ibuprofen   . Methotrexate Sodium Nausea Only    Had been having side effects (trouble with vision, nervous and digestive systems, nausea, lost around 15 pounds), so stopped taking it in October 2017.   . Nsaids     Other reaction(s): Other (See Comments)  . Risedronate Diarrhea  . Celecoxib Anxiety   DISCHARGE MEDICATIONS:   Allergies as of 07/22/2016      Reactions   Ibuprofen    Methotrexate Sodium Nausea Only   Had been having side effects (trouble with vision, nervous and digestive systems, nausea, lost around 15 pounds), so stopped taking it in October 2017.    Nsaids    Other reaction(s): Other (See Comments)   Risedronate Diarrhea   Celecoxib Anxiety      Medication List    TAKE these medications   acetaminophen 650  MG CR tablet Commonly known as:  TYLENOL Take 650 mg by mouth every 8 (eight) hours as needed for pain.   aspirin 81 MG tablet Take 81 mg by mouth daily.   atorvastatin 40 MG tablet Commonly known as:  LIPITOR Take 1 tablet (40 mg total) by mouth daily at 6 PM.   loratadine 10 MG tablet Commonly known as:  CLARITIN Take 10 mg by mouth daily as needed.        DISCHARGE INSTRUCTIONS:  See AVS.   If you experience worsening of your admission symptoms, develop shortness of breath, life threatening emergency, suicidal or homicidal thoughts you must seek medical attention immediately by calling 911 or calling your MD immediately  if symptoms less severe.  You Must read complete instructions/literature along with all the possible adverse reactions/side effects for all the Medicines you take and that have been prescribed to you. Take any new Medicines after you have completely understood and accpet all the possible adverse reactions/side effects.   Please note  You were cared for by a hospitalist during your hospital stay. If you have any questions about your discharge medications or the care you received while you were in the hospital after you are discharged, you can call the unit and asked to speak with the hospitalist on call if the hospitalist that took care of you is not available. Once you are discharged, your primary care physician will handle any further medical issues. Please note that NO REFILLS for any discharge medications  will be authorized once you are discharged, as it is imperative that you return to your primary care physician (or establish a relationship with a primary care physician if you do not have one) for your aftercare needs so that they can reassess your need for medications and monitor your lab values.    On the day of Discharge:  VITAL SIGNS:  Blood pressure (!) 119/58, pulse 60, temperature 98.1 F (36.7 C), temperature source Oral, resp. rate 18, height 5\' 4"   (1.626 m), weight 145 lb 4.8 oz (65.9 kg), SpO2 96 %. PHYSICAL EXAMINATION:  GENERAL:  83 y.o.-year-old patient lying in the bed with no acute distress.  EYES: Pupils equal, round, reactive to light and accommodation. No scleral icterus. Extraocular muscles intact.  HEENT: Head atraumatic, normocephalic. Oropharynx and nasopharynx clear.  NECK:  Supple, no jugular venous distention. No thyroid enlargement, no tenderness.  LUNGS: Normal breath sounds bilaterally, no wheezing, rales,rhonchi or crepitation. No use of accessory muscles of respiration.  CARDIOVASCULAR: S1, S2 normal. No murmurs, rubs, or gallops.  ABDOMEN: Soft, non-tender, non-distended. Bowel sounds present. No organomegaly or mass.  EXTREMITIES: No pedal edema, cyanosis, or clubbing.  NEUROLOGIC: Cranial nerves II through XII are intact. Muscle strength 5/5 in all extremities. Sensation intact. Gait not checked.  PSYCHIATRIC: The patient is alert and oriented x 3.  SKIN: No obvious rash, lesion, or ulcer.  DATA REVIEW:   CBC  Recent Labs Lab 07/22/16 0446  WBC 5.2  HGB 11.4*  HCT 34.2*  PLT 198    Chemistries   Recent Labs Lab 07/21/16 0118  NA 140  K 4.0  CL 108  CO2 28  GLUCOSE 95  BUN 15  CREATININE 0.84  CALCIUM 9.1     Microbiology Results  No results found for this or any previous visit.  RADIOLOGY:  Ct Angio Chest Pe W Or Wo Contrast  Result Date: 07/22/2016 CLINICAL DATA:  83 year old female with chest pain, substernal chest pressure radiating to the jaw and arms since last night EXAM: CT ANGIOGRAPHY CHEST WITH CONTRAST TECHNIQUE: Multidetector CT imaging of the chest was performed using the standard protocol during bolus administration of intravenous contrast. Multiplanar CT image reconstructions and MIPs were obtained to evaluate the vascular anatomy. CONTRAST:  75 mL Isovue 370 COMPARISON:  Portable chest 07/20/2016. FINDINGS: Cardiovascular: Good contrast bolus timing in the pulmonary  arterial tree. No focal filling defect identified in the pulmonary arteries to suggest acute pulmonary embolism. Cardiomegaly. No pericardial effusion. Calcified aortic atherosclerosis. Mediastinum/Nodes: Negative.  No lymphadenopathy. Lungs/Pleura: Major airways are patent. There is mild peripheral peribronchial thickening suspected. Mosaic attenuation intermittently present in both lungs. Superimposed lower lobe atelectasis. Minimal nodularity along the right minor fissure on series 6, image 57 at the anterior middle lobe level. No pleural effusion or other abnormal pulmonary opacity. Upper Abdomen: Small circumscribed simple fluid density areas are partially visible in the right liver in appear benign. Negative visualized noncontrast spleen, pancreas, adrenal glands, kidneys, and bowel in the upper abdomen. Musculoskeletal: Osteopenia. Mild T4 and T7 superior endplate thoracic compression fractures are age indeterminate but favored to be chronic. No suspicious osseous lesion identified. Review of the MIP images confirms the above findings. IMPRESSION: 1.  No evidence of acute pulmonary embolus. 2. Bilateral pulmonary mosaic attenuation such as due to small airway disease. No superimposed acute pulmonary infection or other acute pulmonary process identified. 3. Mild T4 and T7 thoracic compression fractures are age indeterminate but favored to be chronic. Thoracic MRI or Nuclear  Medicine Whole-body Bone Scan would best determine acuity. 4. Cardiomegaly.  Mild calcified aortic atherosclerosis. Electronically Signed   By: Genevie Ann M.D.   On: 07/22/2016 12:26     Management plans discussed with the patient, family and they are in agreement.  CODE STATUS: DNR   TOTAL TIME TAKING CARE OF THIS PATIENT: 35 minutes.    Demetrios Loll M.D on 07/22/2016 at 3:32 PM  Between 7am to 6pm - Pager - [phone]  After 6pm go to [email or web address] - Proofreader  [facility]  Office   [phone]  CC: Primary care physician; Maryland Pink, MD   Note: This dictation was prepared with Dragon dictation along with smaller phrase technology. Any transcriptional errors that result from this process are unintentional.

## 2016-07-22 NOTE — Care Management Important Message (Signed)
Important Message  Patient Details  Name: Alicia Lambert MRN: [id number] Date of Birth: May 31, 1927   Medicare Important Message Given:  Yes Signed IM notice given    Katrina Stack, RN 07/22/2016, 9:08 AM

## 2016-07-22 NOTE — Progress Notes (Signed)
       [facility]  SUBJECTIVE: "i just feel blah". No chest pain or sob   Vitals:   07/21/16 1345 07/21/16 1400 07/21/16 1450 07/21/16 1937  BP: (!) 117/59 140/61 139/62 138/61  Pulse: (!) 56 (!) 50 (!) 52 (!) 56  Resp: 20 18 18 16   Temp:   97.5 F (36.4 C) 98.2 F (36.8 C)  TempSrc:   Oral Oral  SpO2: 94% 96% 94% 97%  Weight:      Height:        Intake/Output Summary (Last 24 hours) at 07/22/16 0736 Last data filed at 07/22/16 0100  Gross per 24 hour  Intake           311.54 ml  Output             1500 ml  Net         -1188.46 ml    LABS: Basic Metabolic Panel:  Recent Labs  07/20/16 1611 07/21/16 0118  NA 138 140  K 4.0 4.0  CL 106 108  CO2 27 28  GLUCOSE 100* 95  BUN 16 15  CREATININE 0.79 0.84  CALCIUM 9.3 9.1   Liver Function Tests: No results for input(s): AST, ALT, ALKPHOS, BILITOT, PROT, ALBUMIN in the last 72 hours. No results for input(s): LIPASE, AMYLASE in the last 72 hours. CBC:  Recent Labs  07/21/16 0118 07/22/16 0446  WBC 5.8 5.2  HGB 11.5* 11.4*  HCT 35.0 34.2*  MCV 95.5 94.6  PLT 217 198   Cardiac Enzymes:  Recent Labs  07/20/16 1227 07/20/16 1611 07/20/16 2020  TROPONINI 4.36* 7.32* 9.15*   BNP: Invalid input(s): POCBNP D-Dimer: No results for input(s): DDIMER in the last 72 hours. Hemoglobin A1C:  Recent Labs  07/20/16 1611  HGBA1C 5.7*   Fasting Lipid Panel:  Recent Labs  07/20/16 1611  CHOL 204*  HDL 72  LDLCALC 111*  TRIG 103  CHOLHDL 2.8   Thyroid Function Tests: No results for input(s): TSH, T4TOTAL, T3FREE, THYROIDAB in the last 72 hours.  Invalid input(s): FREET3 Anemia Panel: No results for input(s): VITAMINB12, FOLATE, FERRITIN, TIBC, IRON, RETICCTPCT in the last 72 hours.   Physical Exam: Blood pressure 138/61, pulse (!) 56, temperature 98.2 F (36.8 C), temperature source Oral, resp. rate 16, height 5\' 4"  (1.626 m), weight 65.9 kg (145 lb 4.8  oz), SpO2 97 %.   Wt Readings from Last 1 Encounters:  07/21/16 65.9 kg (145 lb 4.8 oz)     General appearance: alert and cooperative Resp: clear to auscultation bilaterally Cardio: regular rate and rhythm GI: soft, non-tender; bowel sounds normal; no masses,  no organomegaly Pulses: 2+ and symmetric Neurologic: Grossly normal  TELEMETRY: Reviewed telemetry pt in sr, sb pac:  ASSESSMENT AND PLAN:  Active Problems:   Elevated troponin-etiology unclear. Cardiac cath revealed no evidence of significant cad with normal lv funciton and no regional wall motion abnormality. 25% lcx stenosis. LAD and RCA normal. Will proceed with chest ct to determine if there is any evidence of pulmonary embolus or other chest abnormality. If negative, would d/c heparin and discharge to home. Would remain off of beta blockers due to relative bradycardia.     Teodoro Spray, MD, [facility] 07/22/2016 7:36 AM

## 2016-07-22 NOTE — Progress Notes (Signed)
Discharge instructions along with home medication list and follow up gone over with patient and son. Both verbalized that they understood instructions. Iv removed x2. Telemetry removed. Vascular instructions along with myxn brochure gone over with patient. Dressing to right groin dry and intact. Printed prescription given to patient. No c/o pain no distress noted. Patient to be discharged home.

## 2016-07-22 NOTE — Progress Notes (Signed)
ANTICOAGULATION CONSULT NOTE - FOLLOW UP Consult  Pharmacy Consult for Heparin  Indication: chest pain/ACS  Allergies  Allergen Reactions  . Ibuprofen   . Methotrexate Sodium Nausea Only    Had been having side effects (trouble with vision, nervous and digestive systems, nausea, lost around 15 pounds), so stopped taking it in October 2017.   . Nsaids     Other reaction(s): Other (See Comments)  . Risedronate Diarrhea  . Celecoxib Anxiety    Patient Measurements: Height: 5\' 4"  (162.6 cm) Weight: 145 lb 4.8 oz (65.9 kg) IBW/kg (Calculated) : 54.7 Heparin Dosing Weight: 69.3 kg   Vital Signs: Temp: 98.1 F (36.7 C) (05/25 0750) Temp Source: Oral (05/25 0750) BP: 119/58 (05/25 0750) Pulse Rate: 60 (05/25 0750)  Labs:  Recent Labs  07/20/16 1227  07/20/16 1611 07/20/16 1616 07/20/16 2020 07/21/16 0118 07/21/16 0857 07/22/16 0446  HGB  --   < > 12.1  --   --  11.5*  --  11.4*  HCT  --   --  36.2  --   --  35.0  --  34.2*  PLT  --   --  223  --   --  217  --  198  APTT  --   --   --  30  --   --   --   --   LABPROT  --   --   --  13.0  --   --   --   --   INR  --   --   --  0.98  --   --   --   --   HEPARINUNFRC  --   --   --   --   --  0.39 0.34 <0.10*  CREATININE  --   --  0.79  --   --  0.84  --   --   TROPONINI 4.36*  --  7.32*  --  9.15*  --   --   --   < > = values in this interval not displayed.  Estimated Creatinine Clearance: 44.9 mL/min (by C-G formula based on SCr of 0.84 mg/dL).   Medical History: Past Medical History:  Diagnosis Date  . Arthritis   . Dysrhythmia   . GERD (gastroesophageal reflux disease)     Medications:  Scheduled:  . aspirin EC  81 mg Oral Daily  . atorvastatin  40 mg Oral q1800  . heparin  2,000 Units Intravenous Once  . heparin  3,500 Units Intravenous Once  . sodium chloride flush  3 mL Intravenous Q12H    Assessment: Pharmacy consulted to dose heparin in this 83 year old female admitted with ACS/NSTEMI.  No prior  anticoag noted.  CrCl =   Goal of Therapy:  Heparin level 0.3-0.7 units/ml Monitor platelets by anticoagulation protocol: Yes   Plan:  Heparin level undetectable. Spoke with RN and found that patient has been off Heparin gtt since ~ 1300 on 5/24 despite order still being active. Per MD Bridgett Larsson, Heparin should still be running based on suscipion of possible PE. Will restart Heparin gtt @ 850 units/hr and will give Heparin bolus of 3500 units.   Will recheck Heparin level @ 1800.  Larene Beach, PharmD  Clinical Pharmacist 07/22/2016

## 2016-07-22 NOTE — Discharge Instructions (Signed)
Heart healthy diet

## 2016-07-22 NOTE — Care Management (Signed)
Admitted from home. Independent in all adls, denies issues accessing medical care, obtaining medications or with transportation.  Current with her PCP.  No discharge needs identified at present by care manager or members of care team

## 2016-08-23 ENCOUNTER — Emergency Department
Admission: EM | Admit: 2016-08-23 | Discharge: 2016-08-23 | Disposition: A | Discharge status: Home / Self Care | Payer: Medicare HMO | Attending: Emergency Medicine | Admitting: Emergency Medicine

## 2016-08-23 ENCOUNTER — Emergency Department: Payer: Medicare HMO

## 2016-08-23 ENCOUNTER — Encounter: Payer: Self-pay | Admitting: Emergency Medicine

## 2016-08-23 DIAGNOSIS — R079 Chest pain, unspecified: Secondary | ICD-10-CM | POA: Diagnosis not present

## 2016-08-23 DIAGNOSIS — R7989 Other specified abnormal findings of blood chemistry: Secondary | ICD-10-CM | POA: Diagnosis not present

## 2016-08-23 DIAGNOSIS — Z9861 Coronary angioplasty status: Secondary | ICD-10-CM | POA: Insufficient documentation

## 2016-08-23 LAB — CBC
HCT: 38.7 % (ref 35.0–47.0)
Hemoglobin: 12.8 g/dL (ref 12.0–16.0)
MCH: 32.2 pg (ref 26.0–34.0)
MCHC: 33.2 g/dL (ref 32.0–36.0)
MCV: 96.9 fL (ref 80.0–100.0)
PLATELETS: 201 10*3/uL (ref 150–440)
RBC: 3.99 MIL/uL (ref 3.80–5.20)
RDW: 13.5 % (ref 11.5–14.5)
WBC: 7.8 10*3/uL (ref 3.6–11.0)

## 2016-08-23 LAB — BASIC METABOLIC PANEL
Anion gap: 6 (ref 5–15)
BUN: 14 mg/dL (ref 6–20)
CALCIUM: 9.3 mg/dL (ref 8.9–10.3)
CO2: 25 mmol/L (ref 22–32)
Chloride: 108 mmol/L (ref 101–111)
Creatinine, Ser: 0.66 mg/dL (ref 0.44–1.00)
GFR calc non Af Amer: >60 mL/min (ref >60)
Glucose, Bld: 111 mg/dL — ABNORMAL HIGH (ref 65–99)
Potassium: 3.8 mmol/L (ref 3.5–5.1)
SODIUM: 139 mmol/L (ref 135–145)

## 2016-08-23 LAB — TROPONIN I
TROPONIN I: 0.03 ng/mL — AB (ref <0.03)
TROPONIN I: 0.05 ng/mL — AB (ref <0.03)

## 2016-08-23 MED ORDER — NITROGLYCERIN 0.4 MG SL SUBL
0.4000 mg | SUBLINGUAL_TABLET | Freq: Once | SUBLINGUAL | Status: AC
  Administered 2016-08-23: 0.4 mg via SUBLINGUAL
  Filled 2016-08-23: qty 1

## 2016-08-23 MED ORDER — ISOSORBIDE MONONITRATE ER 30 MG PO TB24: 30.0000 mg | ORAL_TABLET | Freq: Every day | ORAL | 11 refills | Status: DC

## 2016-08-23 MED ORDER — LORAZEPAM 0.5 MG PO TABS: 0.5000 mg | ORAL_TABLET | Freq: Three times a day (TID) | ORAL | 0 refills | Status: AC | PRN

## 2016-08-23 MED ORDER — NITROGLYCERIN 2 % TD OINT
1.0000 [in_us] | TOPICAL_OINTMENT | Freq: Once | TRANSDERMAL | Status: AC
  Administered 2016-08-23: 1 [in_us] via TOPICAL
  Filled 2016-08-23: qty 1

## 2016-08-23 NOTE — ED Provider Notes (Signed)
[facility] Emergency Department Provider Note       Time seen: ----------------------------------------- 8:41 AM on 08/23/2016 -----------------------------------------     I have reviewed the triage vital signs and the nursing notes.   HISTORY   Chief Complaint Chest Pain    HPI Alicia Lambert is a 83 y.o. female who presents to the ED for chest tightness that woke her up out of sleep today at 3:15 this morning. She's had some shortness of breath. He feels like when she was here month ago and had a heart catheterization that was negative. She is not in any respiratory distress, has not had no other associated symptoms. She denies any other recent symptoms or other complaints. Pain is 7 out of 10 at this point.   Past Medical History:  Diagnosis Date  . Arthritis   . Dysrhythmia   . GERD (gastroesophageal reflux disease)     Patient Active Problem List   Diagnosis Date Noted  . Elevated troponin 07/22/2016    Past Surgical History:  Procedure Laterality Date  . BACK SURGERY    . CATARACT EXTRACTION W/PHACO Left 01/26/2015   Procedure: CATARACT EXTRACTION PHACO AND INTRAOCULAR LENS PLACEMENT (IOC);  Surgeon: Estill Cotta, MD;  Location: [facility] ORS;  Service: Ophthalmology;  Laterality: Left;  [place] 01:49 AP% 24.2 CDE 45.30 fluid pack lot # [id number]  . CORONARY ANGIOPLASTY    . CTR    . EYE SURGERY    . LEFT HEART CATH AND CORONARY ANGIOGRAPHY N/A 07/21/2016   Procedure: Left Heart Cath and Coronary Angiography;  Surgeon: Yolonda Kida, MD;  Location: [facility];  Service: Cardiovascular;  Laterality: N/A;    Allergies Ibuprofen; Methotrexate sodium; Nsaids; Risedronate; and Celecoxib  Social History Social History  Substance Use Topics  . Smoking status: Never Smoker  . Smokeless tobacco: Never Used  . Alcohol use No    Review of Systems Constitutional: Negative for fever. Eyes: Negative for vision changes ENT:   Negative for congestion, sore throat Cardiovascular: Positive for chest pain Respiratory: Negative for shortness of breath. Gastrointestinal: Negative for abdominal pain, vomiting and diarrhea. Genitourinary: Negative for dysuria. Musculoskeletal: Negative for back pain. Skin: Negative for rash. Neurological: Negative for headaches, focal weakness or numbness.  All systems negative/normal/unremarkable except as stated in the HPI  ____________________________________________   PHYSICAL EXAM:  VITAL SIGNS: ED Triage Vitals  Enc Vitals Group     BP 08/23/16 0728 (!) 174/83     Pulse Rate 08/23/16 0728 86     Resp 08/23/16 0728 18     Temp 08/23/16 0728 97.5 F (36.4 C)     Temp Source 08/23/16 0728 Oral     SpO2 08/23/16 0728 96 %     Weight 08/23/16 0724 157 lb (71.2 kg)     Height 08/23/16 0724 5\' 3"  (1.6 m)     Head Circumference --      Peak Flow --      Pain Score 08/23/16 0724 7     Pain Loc --      Pain Edu? --      Excl. in [place]? --     Constitutional: Alert and oriented. Well appearing and in no distress. Eyes: Conjunctivae are normal. Normal extraocular movements. ENT   Head: Normocephalic and atraumatic.   Nose: No congestion/rhinnorhea.   Mouth/Throat: Mucous membranes are moist.   Neck: No stridor. Cardiovascular: Normal rate, regular rhythm. No murmurs, rubs, or gallops. Respiratory: Normal respiratory effort without  tachypnea nor retractions. Breath sounds are clear and equal bilaterally. No wheezes/rales/rhonchi. Gastrointestinal: Soft and nontender. Normal bowel sounds Musculoskeletal: Nontender with normal range of motion in extremities. No lower extremity tenderness nor edema. Neurologic:  Normal speech and language. No gross focal neurologic deficits are appreciated.  Skin:  Skin is warm, dry and intact. No rash noted. Psychiatric: Mood and affect are normal. Speech and behavior are normal.   ____________________________________________  EKG: Interpreted by me.Sinus rhythm with occasional PVCs, rate of 77 bpm, normal PR, wide QRS, normal QT, left bundle branch block  ____________________________________________  ED COURSE:  Pertinent labs & imaging results that were available during my care of the patient were reviewed by me and considered in my medical decision making (see chart for details). Patient presents for chest tightness, we will assess with labs and imaging as indicated.   Procedures ____________________________________________   LABS (pertinent positives/negatives)  Labs Reviewed  BASIC METABOLIC PANEL - Abnormal; Notable for the following:       Result Value   Glucose, Bld 111 (*)    All other components within normal limits  TROPONIN I - Abnormal; Notable for the following:    Troponin I 0.03 (*)    All other components within normal limits  TROPONIN I - Abnormal; Notable for the following:    Troponin I 0.05 (*)    All other components within normal limits  CBC    RADIOLOGY  Chest x-ray is normal  ____________________________________________  FINAL ASSESSMENT AND PLAN  Chest pain, mildly elevated troponin  Plan: Patient's labs and imaging were dictated above. Patient had presented for Chest pain which sounds like Prinzmetal angina. I discussed with cardiology on call. Repeat troponin is not significantly changed though it is increasing her symptoms have improved. We are can start her on Imdur 30 mg. Plan of care was intimated in conjunction with cardiology on call Dr. Ubaldo Glassing. He states she'll be seen tomorrow or the next day in office.   Earleen Newport, MD   Note: This note was generated in part or whole with voice recognition software. Voice recognition is usually quite accurate but there are transcription errors that can and very often do occur. I apologize for any typographical errors that were not detected and corrected.      Earleen Newport, MD 08/23/16 [phone]

## 2016-08-23 NOTE — ED Triage Notes (Signed)
Pt c/o chest tightness that woke her out of her sleep today at 315.  has had some SHOB. Feels like when here a month ago when had heart cath.  Elevated troponin noted in May. No respiratory distress. Skin warm and dry in triage.

## 2016-08-23 NOTE — ED Notes (Signed)
Patient transported to X-ray 

## 2016-08-23 NOTE — ED Notes (Signed)
Pt alert and oriented X4, active, cooperative, pt in NAD. RR even and unlabored, color WNL.  Pt informed to return if any life threatening symptoms occur.   

## 2017-03-17 DIAGNOSIS — I272 Pulmonary hypertension, unspecified: Secondary | ICD-10-CM

## 2017-03-17 DIAGNOSIS — I5189 Other ill-defined heart diseases: Secondary | ICD-10-CM

## 2017-03-17 HISTORY — DX: Pulmonary hypertension, unspecified: I27.20

## 2017-03-17 HISTORY — DX: Other ill-defined heart diseases: I51.89

## 2017-03-27 ENCOUNTER — Ambulatory Visit: Payer: Medicare HMO

## 2017-03-27 ENCOUNTER — Ambulatory Visit: Payer: Medicare HMO | Admitting: Podiatry

## 2017-03-27 ENCOUNTER — Encounter (INDEPENDENT_AMBULATORY_CARE_PROVIDER_SITE_OTHER): Payer: Medicare HMO | Admitting: Podiatry

## 2017-03-27 DIAGNOSIS — M779 Enthesopathy, unspecified: Principal | ICD-10-CM

## 2017-03-27 DIAGNOSIS — M778 Other enthesopathies, not elsewhere classified: Secondary | ICD-10-CM

## 2017-03-27 DIAGNOSIS — M7752 Other enthesopathy of left foot: Secondary | ICD-10-CM

## 2017-03-27 NOTE — Progress Notes (Signed)
This encounter was created in error - please disregard.

## 2017-04-07 ENCOUNTER — Other Ambulatory Visit: Payer: Self-pay | Admitting: Physical Medicine and Rehabilitation

## 2017-04-07 DIAGNOSIS — M5416 Radiculopathy, lumbar region: Secondary | ICD-10-CM

## 2017-04-07 DIAGNOSIS — M5136 Other intervertebral disc degeneration, lumbar region: Secondary | ICD-10-CM

## 2017-04-19 ENCOUNTER — Ambulatory Visit
Admission: RE | Admit: 2017-04-19 | Discharge: 2017-04-19 | Disposition: A | Discharge status: Home / Self Care | Payer: Medicare HMO | Source: Ambulatory Visit | Attending: Physical Medicine and Rehabilitation | Admitting: Physical Medicine and Rehabilitation

## 2017-04-19 DIAGNOSIS — M5136 Other intervertebral disc degeneration, lumbar region: Secondary | ICD-10-CM

## 2017-04-19 DIAGNOSIS — M5416 Radiculopathy, lumbar region: Secondary | ICD-10-CM

## 2017-04-27 ENCOUNTER — Other Ambulatory Visit: Payer: Self-pay | Admitting: Family Medicine

## 2017-04-27 DIAGNOSIS — R9389 Abnormal findings on diagnostic imaging of other specified body structures: Secondary | ICD-10-CM

## 2017-05-11 ENCOUNTER — Other Ambulatory Visit: Payer: Self-pay | Admitting: Family Medicine

## 2017-05-11 DIAGNOSIS — K7689 Other specified diseases of liver: Secondary | ICD-10-CM

## 2017-05-11 DIAGNOSIS — N289 Disorder of kidney and ureter, unspecified: Secondary | ICD-10-CM

## 2017-05-11 DIAGNOSIS — R9389 Abnormal findings on diagnostic imaging of other specified body structures: Secondary | ICD-10-CM

## 2017-05-12 ENCOUNTER — Ambulatory Visit
Admission: RE | Admit: 2017-05-12 | Discharge: 2017-05-12 | Disposition: A | Discharge status: Home / Self Care | Payer: Medicare HMO | Source: Ambulatory Visit | Attending: Family Medicine | Admitting: Family Medicine

## 2017-05-12 DIAGNOSIS — R9389 Abnormal findings on diagnostic imaging of other specified body structures: Secondary | ICD-10-CM

## 2017-05-12 DIAGNOSIS — K7689 Other specified diseases of liver: Secondary | ICD-10-CM | POA: Insufficient documentation

## 2017-05-12 DIAGNOSIS — N289 Disorder of kidney and ureter, unspecified: Secondary | ICD-10-CM

## 2017-05-12 DIAGNOSIS — N281 Cyst of kidney, acquired: Secondary | ICD-10-CM | POA: Diagnosis not present

## 2018-03-11 IMAGING — MR MR LUMBAR SPINE W/O CM
4 of 5 series · 23 of 48 positions shown · non-contrast
Comparison: None.

CLINICAL DATA: Low back pain starting 2-3 months ago. Prior back
surgery in 2448.

EXAM:
MRI LUMBAR SPINE WITHOUT CONTRAST
TECHNIQUE: Multiplanar, multisequence MR imaging of the lumbar spine was
performed. No intravenous contrast was administered.

[Series 2: T2 · sagittal · 4.0mm · 0.55mm/px · 7 of 15 slices shown (1 of 2)]
[im 1/15]
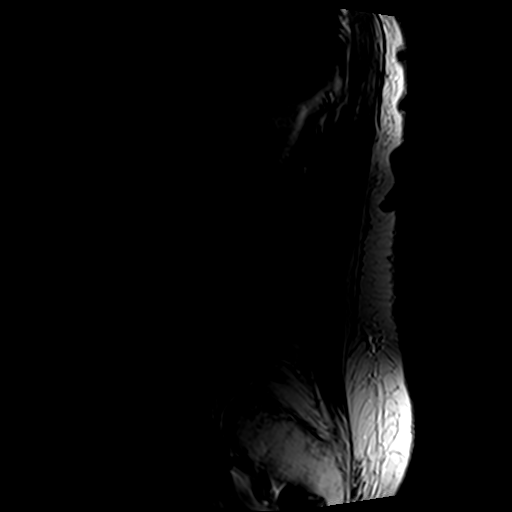
[im 3/15]
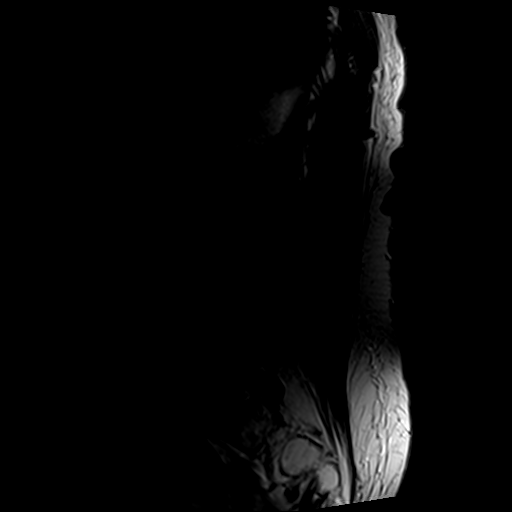
[im 5/15]
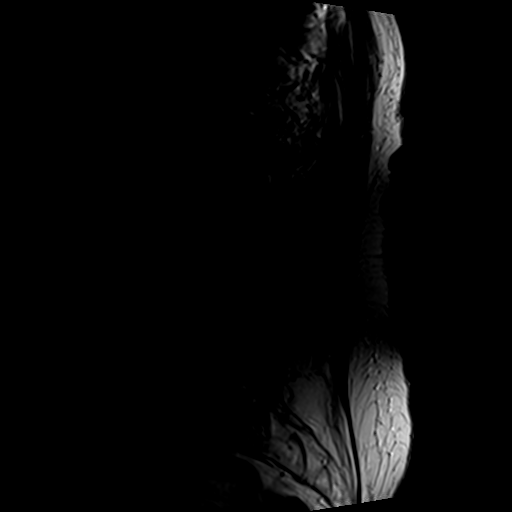
[im 8/15]
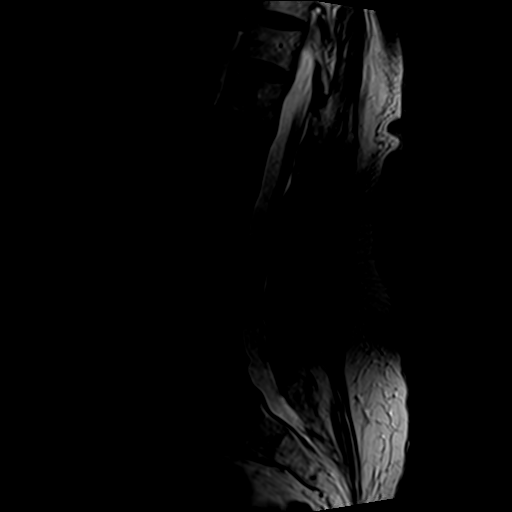
[im 10/15]
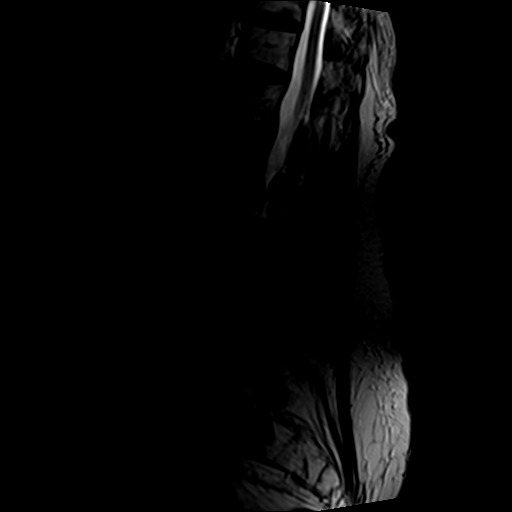
[im 12/15]
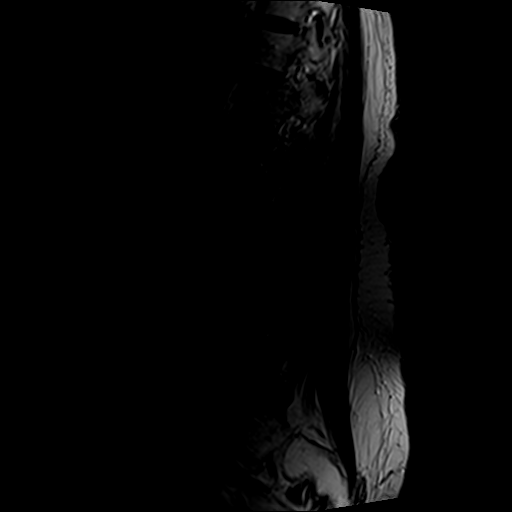
[im 15/15]
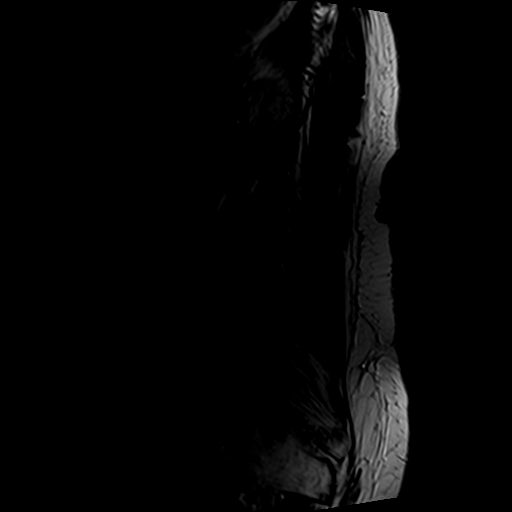

[Series 4: T1 · sagittal · 4.0mm · 0.55mm/px · 5 of 15 slices shown (1 of 2)]
[im 1/15]
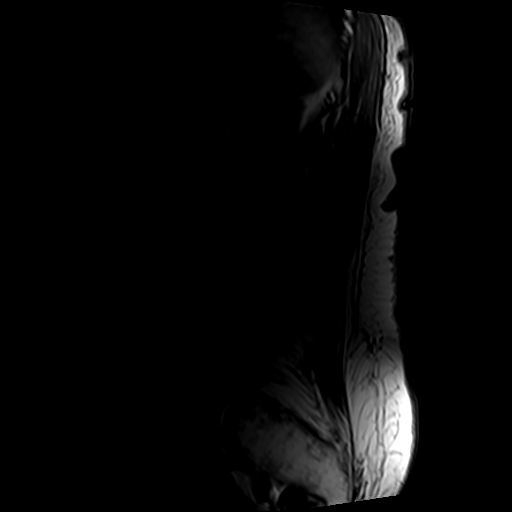
[im 3/15]
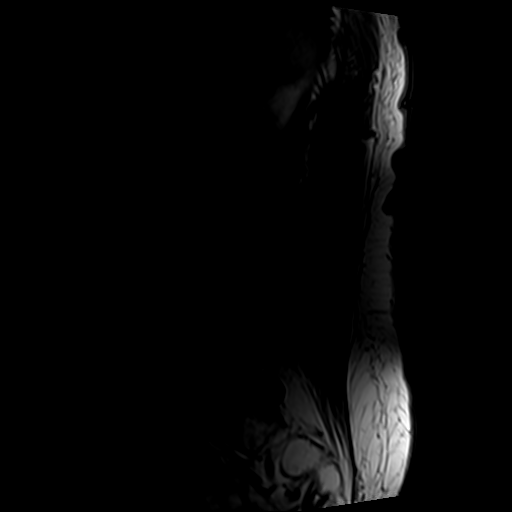
[im 6/15]
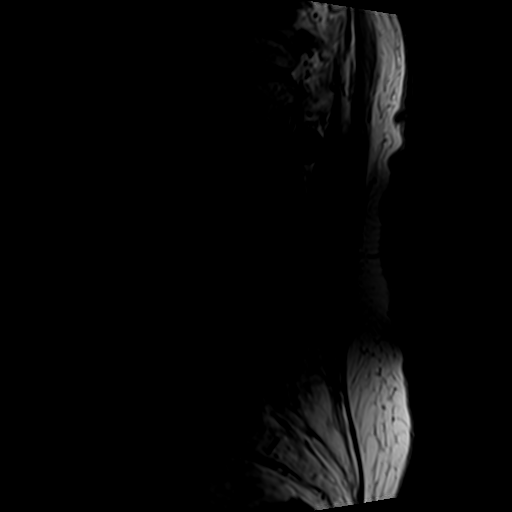
[im 9/15]
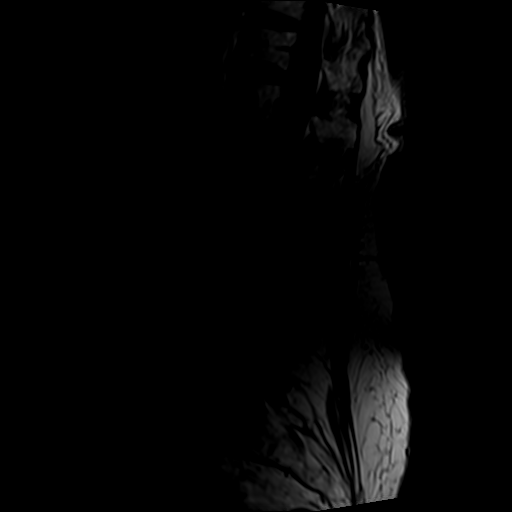
[im 15/15]
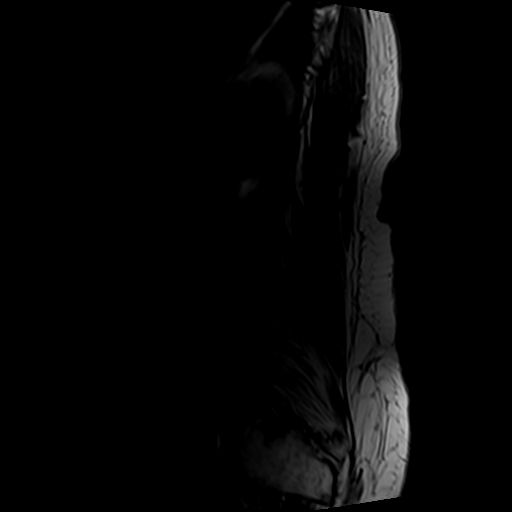

[Series 5: T2 · axial · 4.0mm · 0.70mm/px · z∈[-87,+121]mm · 8 of 33 slices shown (2 of 2)]
[im 1/33]
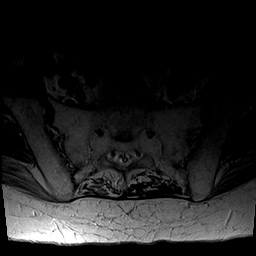
[im 5/33]
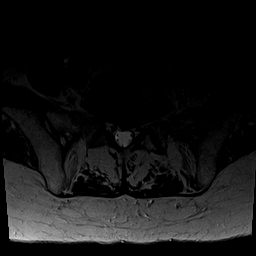
[im 10/33]
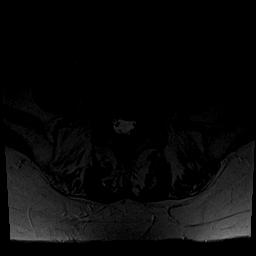
[im 15/33]
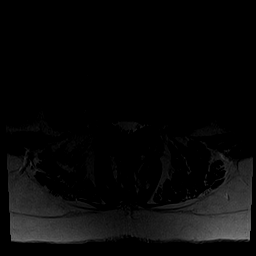
[im 18/33]
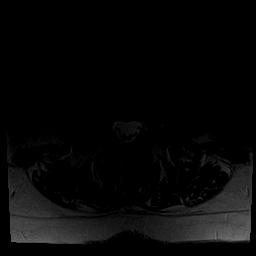
[im 23/33]
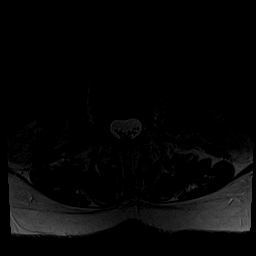
[im 28/33]
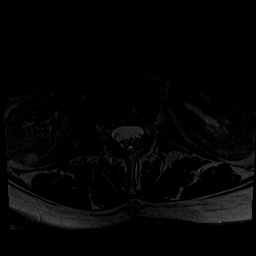
[im 33/33]
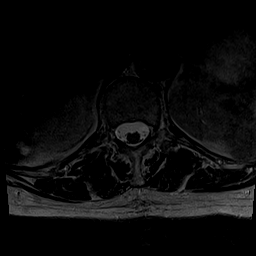

[Series 6: T1 · axial · 4.0mm · 0.35mm/px · z∈[-66,+76]mm · 3 of 33 slices shown (2 of 2)]
[im 5/33]
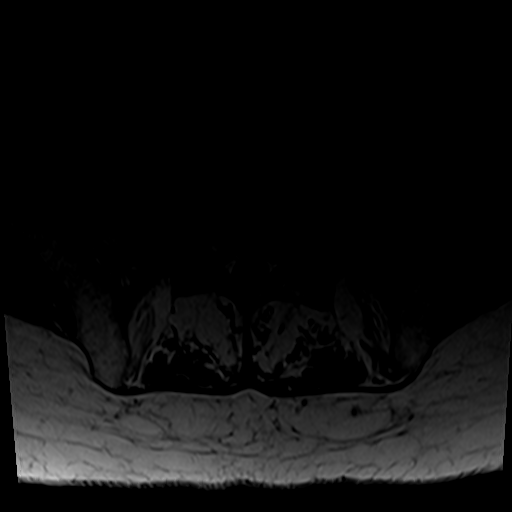
[im 18/33]
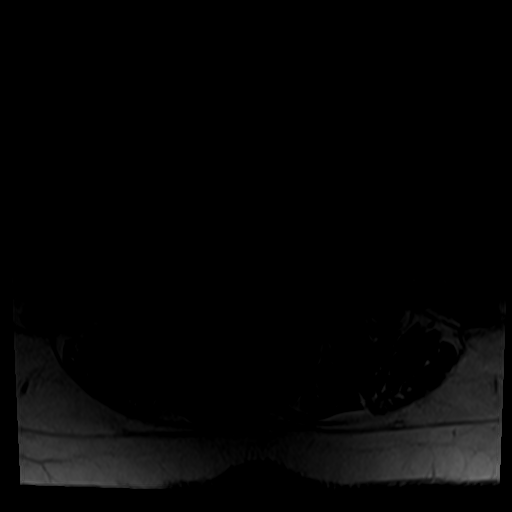
[im 28/33]
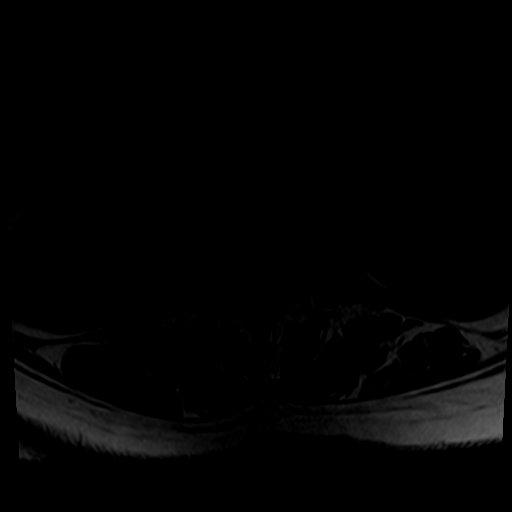

[23 of 48 positions shown; findings below may reference images not displayed]

FINDINGS: Despite efforts by the technologist and patient, motion artifact is
present on today's exam and could not be eliminated. This reduces
exam sensitivity and specificity.

Segmentation: The lowest lumbar type non-rib-bearing vertebra is
labeled as L5.

Alignment: Minimal dextroconvex lumbar scoliosis with rotary
component. No subluxation.

Vertebrae: Mild type 1 degenerative endplate findings at L2-3, L4-5,
and L5-S1.

Disc desiccation at all levels between L2 and S1 with loss of disc
height most notably at L4-5, and to a lesser extent at L5-S1. Subtle
inferior endplate concavity at L 3, query remote minimal compression
injury.

Conus medullaris and cauda equina: Conus extends to the L1 level.
Conus and cauda equina appear normal.

Paraspinal and other soft tissues: We include the bottom of a 1.1 by
0.7 cm T2 hyperintense lesion of the right posterior hepatic lobe on
image [DATE]. A fluid signal intensity lesion of the right kidney is
partially included on today's exam and statistically likely to
represent a cyst although technically nonspecific.

Disc levels:

L1-2: No impingement.  Disc bulge and bilateral facet arthropathy.

L2-3: Moderate central narrowing of the thecal sac with mild
bilateral subarticular lateral recess stenosis, mild bilateral
foraminal stenosis, and mild displacement of the left L2 nerve in
the lateral extraforaminal space due to disc bulge, left inferior
foraminal and lateral extraforaminal disc protrusion, and facet
arthropathy.

L3-4: Mild bilateral subarticular lateral recess stenosis with mild
right and borderline left foraminal stenosis due to disc bulge and
facet arthropathy.

L4-5: Mild bilateral foraminal stenosis with mild left and
borderline right subarticular lateral recess stenosis and borderline
central narrowing of the thecal sac due to disc bulge, facet
arthropathy, and intervertebral spurring. Prior right
hemilaminectomy at this level.

L5-S1: Mild bilateral subarticular lateral recess stenosis and
borderline bilateral foraminal stenosis due to intervertebral and
facet spurring along with disc bulge and central disc protrusion.
IMPRESSION: 1. Lumbar spondylosis and degenerative disc disease, causing
moderate impingement at L2-3, and mild impingement at L3-4, L4-5,
and L5-S1 as detailed above
2. Fluid signal intensity lesion of the right kidney is probably a
cyst. Similar partially included tiny lesion of the right hepatic
lobe is technically nonspecific but likely a cyst.

## 2018-09-26 ENCOUNTER — Other Ambulatory Visit: Payer: Self-pay

## 2018-09-26 DIAGNOSIS — Z20822 Contact with and (suspected) exposure to covid-19: Secondary | ICD-10-CM

## 2018-09-28 LAB — NOVEL CORONAVIRUS, NAA: SARS-CoV-2, NAA: NOT DETECTED

## 2019-07-08 ENCOUNTER — Ambulatory Visit (INDEPENDENT_AMBULATORY_CARE_PROVIDER_SITE_OTHER): Payer: Medicare HMO | Admitting: Vascular Surgery

## 2019-07-08 ENCOUNTER — Encounter (INDEPENDENT_AMBULATORY_CARE_PROVIDER_SITE_OTHER): Payer: Self-pay | Admitting: Vascular Surgery

## 2019-07-08 ENCOUNTER — Other Ambulatory Visit: Payer: Self-pay

## 2019-07-08 DIAGNOSIS — I25118 Atherosclerotic heart disease of native coronary artery with other forms of angina pectoris: Secondary | ICD-10-CM | POA: Diagnosis not present

## 2019-07-08 DIAGNOSIS — I2510 Atherosclerotic heart disease of native coronary artery without angina pectoris: Secondary | ICD-10-CM | POA: Insufficient documentation

## 2019-07-08 DIAGNOSIS — M67441 Ganglion, right hand: Secondary | ICD-10-CM

## 2019-07-08 NOTE — Progress Notes (Signed)
MRN : [id number]  Alicia Lambert is a 86 y.o. (April 14, 1927) female who presents with chief complaint of No chief complaint on file. Marland Kitchen  History of Present Illness:Chief complaint: I have not on the back of my right ring finger  Location: Dorsal surface of the right fourth finger Character/quality of the symptom: Aching Severity: Mild Duration: This bulge has been present for many years she was actually seen by Dr. Lucky Cowboy years ago for the same issue.  Her son notes that since about Christmas it seems to have gotten significantly larger Timing/onset: Intermittent Aggravating/context: Pressure or mild trauma Relieving/modifying: None  No outpatient medications have been marked as taking for the 07/08/19 encounter (Appointment) with Delana Meyer, Dolores Lory, MD.    Past Medical History:  Diagnosis Date  . Arthritis   . Dysrhythmia   . GERD (gastroesophageal reflux disease)     Past Surgical History:  Procedure Laterality Date  . BACK SURGERY    . CATARACT EXTRACTION W/PHACO Left 01/26/2015   Procedure: CATARACT EXTRACTION PHACO AND INTRAOCULAR LENS PLACEMENT (IOC);  Surgeon: Estill Cotta, MD;  Location: [facility];  Service: Ophthalmology;  Laterality: Left;  [place] 01:49 AP% 24.2 CDE 45.30 fluid pack lot # [id number]  . CORONARY ANGIOPLASTY    . CTR    . EYE SURGERY    . LEFT HEART CATH AND CORONARY ANGIOGRAPHY N/A 07/21/2016   Procedure: Left Heart Cath and Coronary Angiography;  Surgeon: Yolonda Kida, MD;  Location: [facility];  Service: Cardiovascular;  Laterality: N/A;    Social History Social History   Tobacco Use  . Smoking status: Never Smoker  . Smokeless tobacco: Never Used  Substance Use Topics  . Alcohol use: No  . Drug use: Not on file    Family History Family History  Problem Relation Age of Onset  . CAD Father   . CAD Son   No family history of bleeding/clotting disorders, porphyria or autoimmune disease   Allergies  Allergen Reactions  .  Ibuprofen   . Methotrexate Sodium Nausea Only    Had been having side effects (trouble with vision, nervous and digestive systems, nausea, lost around 15 pounds), so stopped taking it in October 2017.   . Nsaids     Other reaction(s): Other (See Comments)  . Risedronate Diarrhea  . Celecoxib Anxiety     REVIEW OF SYSTEMS (Negative unless checked)  Constitutional: [] Weight loss  [] Fever  [] Chills Cardiac: [x] Chest pain   [] Chest pressure   [] Palpitations   [] Shortness of breath when laying flat   [] Shortness of breath with exertion. Vascular:  [] Pain in legs with walking   [] Pain in legs at rest  [] History of DVT   [] Phlebitis   [] Swelling in legs   [] Varicose veins   [] Non-healing ulcers Pulmonary:   [] Uses home oxygen   [] Productive cough   [] Hemoptysis   [] Wheeze  [] COPD   [] Asthma Neurologic:  [] Dizziness   [] Seizures   [] History of stroke   [] History of TIA  [] Aphasia   [] Vissual changes   [] Weakness or numbness in arm   [] Weakness or numbness in leg Musculoskeletal:   [] Joint swelling   [] Joint pain   [] Low back pain Hematologic:  [] Easy bruising  [] Easy bleeding   [] Hypercoagulable state   [] Anemic Gastrointestinal:  [] Diarrhea   [] Vomiting  [] Gastroesophageal reflux/heartburn   [] Difficulty swallowing. Genitourinary:  [] Chronic kidney disease   [] Difficult urination  [] Frequent urination   [] Blood in urine Skin:  [] Rashes   [] Ulcers  Psychological:  [] History of anxiety   []  History of major depression.  Physical Examination  There were no vitals filed for this visit. There is no height or weight on file to calculate BMI. Gen: WD/WN, NAD Head: [place]/AT, No temporalis wasting.  Ear/Nose/Throat: Hearing grossly intact, nares w/o erythema or drainage, poor dentition Eyes: PER, EOMI, sclera nonicteric.  Neck: Supple, no masses.  No bruit or JVD.  Pulmonary:  Good air movement, clear to auscultation bilaterally, no use of accessory muscles.  Cardiac: RRR, normal S1, S2, no  Murmurs. Vascular: Round smooth nodule on the dorsal surface of the right fourth finger.  It is mobile.  It is minimally tender.  It is noncompressible.  It is nonpulsatile. Vessel Right Left  Radial Palpable Palpable  Gastrointestinal: soft, non-distended. No guarding/no peritoneal signs.  Musculoskeletal: M/S 5/5 throughout.  No deformity or atrophy.  Neurologic: CN 2-12 intact. Pain and light touch intact in extremities.  Symmetrical.  Speech is fluent. Motor exam as listed above. Psychiatric: Judgment intact, Mood & affect appropriate for pt's clinical situation. Dermatologic: No rashes or ulcers noted.  No changes consistent with cellulitis.   CBC Lab Results  Component Value Date   WBC 7.8 08/23/2016   HGB 12.8 08/23/2016   HCT 38.7 08/23/2016   MCV 96.9 08/23/2016   PLT 201 08/23/2016    BMET    Component Value Date/Time   NA 139 08/23/2016 0746   K 3.8 08/23/2016 0746   CL 108 08/23/2016 0746   CO2 25 08/23/2016 0746   GLUCOSE 111 (H) 08/23/2016 0746   BUN 14 08/23/2016 0746   CREATININE 0.66 08/23/2016 0746   CALCIUM 9.3 08/23/2016 0746   GFRNONAA >60 08/23/2016 0746   GFRAA >60 08/23/2016 0746   CrCl cannot be calculated (Patient's most recent lab result is older than the maximum 21 days allowed.).  COAG Lab Results  Component Value Date   INR 0.98 07/20/2016    Radiology No results found.   Assessment/Plan 1. Ganglion cyst of finger of right hand I believe that this mass on her right fourth finger is a ganglion cyst.  I will arrange for her to have an evaluation at [facility].  No vascular surgeries or interventions recommended.  2. Coronary artery disease of native artery of native heart with stable angina pectoris ([place]) Continue cardiac and antihypertensive medications as already ordered and reviewed, no changes at this time.  Continue statin as ordered and reviewed, no changes at this time  Nitrates PRN for chest pain    Hortencia Pilar,  MD  07/08/2019 10:41 AM

## 2021-03-15 ENCOUNTER — Other Ambulatory Visit: Payer: Self-pay | Admitting: Orthopedic Surgery

## 2021-03-15 DIAGNOSIS — M1711 Unilateral primary osteoarthritis, right knee: Secondary | ICD-10-CM

## 2021-03-25 ENCOUNTER — Other Ambulatory Visit: Payer: Self-pay

## 2021-03-25 ENCOUNTER — Ambulatory Visit
Admission: RE | Admit: 2021-03-25 | Discharge: 2021-03-25 | Disposition: A | Discharge status: Home / Self Care | Payer: Medicare HMO | Source: Ambulatory Visit | Attending: Orthopedic Surgery | Admitting: Orthopedic Surgery

## 2021-03-25 DIAGNOSIS — M1711 Unilateral primary osteoarthritis, right knee: Secondary | ICD-10-CM | POA: Insufficient documentation

## 2021-04-22 ENCOUNTER — Other Ambulatory Visit: Payer: Self-pay | Admitting: Orthopedic Surgery

## 2021-04-30 ENCOUNTER — Inpatient Hospital Stay: Admission: RE | Admit: 2021-04-30 | Payer: Medicare HMO | Source: Ambulatory Visit

## 2021-05-03 ENCOUNTER — Encounter
Admission: RE | Admit: 2021-05-03 | Discharge: 2021-05-03 | Disposition: A | Discharge status: Home / Self Care | Payer: Medicare HMO | Source: Ambulatory Visit | Attending: Orthopedic Surgery | Admitting: Orthopedic Surgery

## 2021-05-03 ENCOUNTER — Other Ambulatory Visit: Payer: Self-pay

## 2021-05-03 VITALS — BP 160/71 | HR 54 | Resp 16 | Ht 64.0 in | Wt 158.1 lb

## 2021-05-03 DIAGNOSIS — Z01818 Encounter for other preprocedural examination: Secondary | ICD-10-CM

## 2021-05-03 DIAGNOSIS — Z01812 Encounter for preprocedural laboratory examination: Secondary | ICD-10-CM | POA: Diagnosis not present

## 2021-05-03 HISTORY — DX: Tachycardia, unspecified: R00.0

## 2021-05-03 HISTORY — DX: Nonrheumatic aortic (valve) insufficiency: I35.1

## 2021-05-03 HISTORY — DX: Other specified abnormalities of plasma proteins: R77.8

## 2021-05-03 HISTORY — DX: Malignant (primary) neoplasm, unspecified: C80.1

## 2021-05-03 HISTORY — DX: Atherosclerotic heart disease of native coronary artery without angina pectoris: I25.10

## 2021-05-03 HISTORY — DX: Rheumatoid arthritis, unspecified: M06.9

## 2021-05-03 LAB — CBC WITH DIFFERENTIAL/PLATELET
Abs Immature Granulocytes: 0.01 10*3/uL (ref 0.00–0.07)
Basophils Absolute: 0.1 10*3/uL (ref 0.0–0.1)
Basophils Relative: 1 %
Eosinophils Absolute: 0.2 10*3/uL (ref 0.0–0.5)
Eosinophils Relative: 2 %
HCT: 38.9 % (ref 36.0–46.0)
Hemoglobin: 12.4 g/dL (ref 12.0–15.0)
Immature Granulocytes: 0 %
Lymphocytes Relative: 38 %
Lymphs Abs: 2.5 10*3/uL (ref 0.7–4.0)
MCH: 31.5 pg (ref 26.0–34.0)
MCHC: 31.9 g/dL (ref 30.0–36.0)
MCV: 98.7 fL (ref 80.0–100.0)
Monocytes Absolute: 0.6 10*3/uL (ref 0.1–1.0)
Monocytes Relative: 9 %
Neutro Abs: 3.4 10*3/uL (ref 1.7–7.7)
Neutrophils Relative %: 50 %
Platelets: 211 10*3/uL (ref 150–400)
RBC: 3.94 MIL/uL (ref 3.87–5.11)
RDW: 12.5 % (ref 11.5–15.5)
WBC: 6.7 10*3/uL (ref 4.0–10.5)
nRBC: 0 % (ref 0.0–0.2)

## 2021-05-03 LAB — COMPREHENSIVE METABOLIC PANEL
ALT: 12 U/L (ref 0–44)
AST: 24 U/L (ref 15–41)
Albumin: 4 g/dL (ref 3.5–5.0)
Alkaline Phosphatase: 79 U/L (ref 38–126)
Anion gap: 6 (ref 5–15)
BUN: 28 mg/dL — ABNORMAL HIGH (ref 8–23)
CO2: 27 mmol/L (ref 22–32)
Calcium: 9.7 mg/dL (ref 8.9–10.3)
Chloride: 105 mmol/L (ref 98–111)
Creatinine, Ser: 1.04 mg/dL — ABNORMAL HIGH (ref 0.44–1.00)
GFR, Estimated: 51 mL/min — ABNORMAL LOW (ref >60)
Glucose, Bld: 94 mg/dL (ref 70–99)
Potassium: 3.9 mmol/L (ref 3.5–5.1)
Sodium: 138 mmol/L (ref 135–145)
Total Bilirubin: 0.5 mg/dL (ref 0.3–1.2)
Total Protein: 7.3 g/dL (ref 6.5–8.1)

## 2021-05-03 LAB — URINALYSIS, ROUTINE W REFLEX MICROSCOPIC
Bilirubin Urine: NEGATIVE
Glucose, UA: NEGATIVE mg/dL
Hgb urine dipstick: NEGATIVE
Ketones, ur: NEGATIVE mg/dL
Nitrite: NEGATIVE
Protein, ur: NEGATIVE mg/dL
Specific Gravity, Urine: 1.017 (ref 1.005–1.030)
pH: 5 (ref 5.0–8.0)

## 2021-05-03 LAB — SURGICAL PCR SCREEN
MRSA, PCR: NEGATIVE
Staphylococcus aureus: NEGATIVE

## 2021-05-03 LAB — TYPE AND SCREEN
ABO/RH(D): O NEG
Antibody Screen: NEGATIVE

## 2021-05-03 NOTE — Patient Instructions (Addendum)
Your procedure is scheduled on:05-13-21 Thursday ?Report to the Registration Desk on the 1st floor of the [facility].Then proceed to the 2nd floor Surgery Desk in the [facility] ?To find out your arrival time, please call [phone] between 1PM - 3PM on:05-12-21 Wednesday ? ?REMEMBER: ?Instructions that are not followed completely may result in serious medical risk, up to and including death; or upon the discretion of your surgeon and anesthesiologist your surgery may need to be rescheduled. ? ?Do not eat food after midnight the night before surgery.  ?No gum chewing, lozengers or hard candies. ? ?You may however, drink CLEAR liquids up to 2 hours before you are scheduled to arrive for your surgery. Do not drink anything within 2 hours of your scheduled arrival time. ? ?Clear liquids include: ?- water  ?- apple juice without pulp ?- gatorade (not RED colors) ?- black coffee or tea (Do NOT add milk or creamers to the coffee or tea) ?Do NOT drink anything that is not on this list. ? ?In addition, your doctor has ordered for you to drink the provided  ?Ensure Pre-Surgery Clear Carbohydrate Drink ?Drinking this carbohydrate drink up to two hours before surgery helps to reduce insulin resistance and improve patient outcomes. Please complete drinking 2 hours prior to scheduled arrival time. ? ?TAKE THESE MEDICATIONS THE MORNING OF SURGERY WITH A SIP OF WATER: ?-gabapentin (NEURONTIN)  ?-metoprolol tartrate (LOPRESSOR)  ? ? stop your aspirin 81 MG tablet  7 days prior to surgery-Last dose on 05-05-21 Wednesday ? ?One week prior to surgery:Last dose on 05-05-21 ?Stop Anti-inflammatories (NSAIDS) such as Advil, Aleve, Ibuprofen, Motrin, Naproxen, Naprosyn and Aspirin based products such as Excedrin, Goodys Powder, BC Powder.You may however, continue to take Tylenol if needed for pain up until the day of surgery. ? ?Stop ANY OVER THE COUNTER supplements/vitamins 7 days prior to surgery (Multiple Vitamins and vitamin  B-12) ? ?No Alcohol for 24 hours before or after surgery. ? ?No Smoking including e-cigarettes for 24 hours prior to surgery.  ?No chewable tobacco products for at least 6 hours prior to surgery.  ?No nicotine patches on the day of surgery. ? ?Do not use any "recreational" drugs for at least a week prior to your surgery.  ?Please be advised that the combination of cocaine and anesthesia may have negative outcomes, up to and including death. ?If you test positive for cocaine, your surgery will be cancelled. ? ?On the morning of surgery brush your teeth with toothpaste and water, you may rinse your mouth with mouthwash if you wish. ?Do not swallow any toothpaste or mouthwash. ? ?Use CHG Soap as directed on instruction sheet. ? ?Do not wear jewelry, make-up, hairpins, clips or nail polish. ? ?Do not wear lotions, powders, or perfumes.  ? ?Do not shave body from the neck down 48 hours prior to surgery just in case you cut yourself which could leave a site for infection.  ?Also, freshly shaved skin may become irritated if using the CHG soap. ? ?Contact lenses, hearing aids and dentures may not be worn into surgery. ? ?Do not bring valuables to the hospital. [facility] is not responsible for any missing/lost belongings or valuables.   ? ?Notify your doctor if there is any change in your medical condition (cold, fever, infection). ? ?Wear comfortable clothing (specific to your surgery type) to the hospital. ? ?After surgery, you can help prevent lung complications by doing breathing exercises.  ?Take deep breaths and cough every 1-2 hours. Your  doctor may order a device called an Incentive Spirometer to help you take deep breaths. ?When coughing or sneezing, hold a pillow firmly against your incision with both hands. This is called ?splinting.? Doing this helps protect your incision. It also decreases belly discomfort. ? ?If you are being admitted to the hospital overnight, leave your suitcase in the car. ?After surgery  it may be brought to your room. ? ?If you are being discharged the day of surgery, you will not be allowed to drive home. ?You will need a responsible adult (18 years or older) to drive you home and stay with you that night.  ? ?If you are taking public transportation, you will need to have a responsible adult (18 years or older) with you. ?Please confirm with your physician that it is acceptable to use public transportation.  ? ?Please call the [facility] at [phone] if you have any questions about these instructions. ? ?Surgery Visitation Policy: ? ?Patients undergoing a surgery or procedure may have one family member or support person with them as long as that person is not COVID-19 positive or experiencing its symptoms.  ?That person may remain in the waiting area during the procedure and may rotate out with other people. ? ?Inpatient Visitation:   ? ?Visiting hours are 7 a.m. to 8 p.m. ?Up to two visitors ages 16+ are allowed at one time in a patient room. The visitors may rotate out with other people during the day. Visitors must check out when they leave, or other visitors will not be allowed. One designated support person may remain overnight. ?The visitor must pass COVID-19 screenings, use hand sanitizer when entering and exiting the patient?s room and wear a mask at all times, including in the patient?s room. ?Patients must also wear a mask when staff or their visitor are in the room. ?Masking is required regardless of vaccination status.  ?

## 2021-05-05 ENCOUNTER — Other Ambulatory Visit: Payer: Medicare HMO

## 2021-05-07 ENCOUNTER — Encounter: Payer: Self-pay | Admitting: Orthopedic Surgery

## 2021-05-07 NOTE — Progress Notes (Signed)
Perioperative Services  Pre-Admission/Anesthesia Testing Clinical Review  Date: 05/07/21  Patient Demographics:  Name: Alicia Lambert DOB:   13-Dec-1927 MRN:   [id number]  Planned Surgical Procedure(s):    Case: [id number] Date/Time: 05/13/21 0936   Procedure: TOTAL KNEE ARTHROPLASTY (Right: Knee)   Anesthesia type: Choice   Pre-op diagnosis: Primary osteoarthritis of right knee M17.11   Location: [facility] OR ROOM 01 / [place] [facility]   Surgeons: Hessie Knows, MD   NOTE: Available PAT nursing documentation and vital signs have been reviewed. Clinical nursing staff has updated patient's PMH/Lambert, current medication list, and drug allergies/intolerances to ensure comprehensive history available to assist in medical decision making as it pertains to the aforementioned surgical procedure and anticipated anesthetic course. Extensive review of available clinical information performed. Alicia Lambert updated with any diagnoses/procedures that  may have been inadvertently omitted during her intake with the pre-admission testing department's nursing staff.  Clinical Discussion:  Alicia Lambert is a 88 y.o. female who is submitted for pre-surgical anesthesia review and clearance prior to her undergoing the above procedure. Patient has never been a smoker. Pertinent PMH includes: CAD, LBBB, PVD, aortic atherosclerosis, angina, diastolic dysfunction, pulmonary hypertension, palpitations, HTN, HLD, GERD (no daily Tx), OA, RA, memory difficulties,.  Patient is followed by cardiology Clayborn Bigness, MD). She was last seen in the cardiology clinic on 04/22/2021; notes reviewed.  At the time of her clinic visit, patient doing well overall from a cardiovascular perspective.  She denied any episodes of chest pain, shortness of breath, PND, orthopnea, palpitations, significant peripheral edema, vertiginous symptoms, or presyncope/syncope.  PMH significant for cardiovascular diagnoses.  Patient  underwent diagnostic left heart catheterization on 07/21/2016 revealing a normal left ventricular systolic function with an EF of 55-65%.  LVEDP was normal.  There was mild nonobstructive CAD noted; 25% stenosis of the proximal LCx.  No intervention was required.  Recommendations were for continued medical management.  Long-term cardiac event monitor study performed on 12/06/2017 revealing an underlying sinus rhythm with sinus bradycardia with an average heart rate of 78 bpm; range 50-137 bpm. There were frequent PVCs (23,039) making up 10% of the heartbeats.  There were no sustained runs.  There were no pauses.  PVCs were rare.  Last TTE was performed on 05/13/2019 revealing a mildly reduced left ventricular systolic function with an EF of 45%.  There was mild LVH and left atrial enlargement.  Trivial pulmonary, mild mitral/tricuspid, and moderate aortic valve regurgitation noted.  Diastolic Doppler parameters consistent with impaired relaxation (G1DD).  There was no evidence of a significant transvalvular gradient to suggest stenosis.  There was mild pulmonary hypertension noted; RVSP 42.0 mmHg.  Blood pressure well controlled at 128/80 on currently prescribed beta-blocker monotherapy.  Patient not currently taking any type of lipid-lowering therapies for her HLD and further ASCVD prevention.  She is not diabetic.  Patient remains fairly active for her age. Functional capacity, as defined by DASI, is documented as being >/= 4 METS.  No changes were made to her medication history.  Patient to follow-up with outpatient cardiology in 6 months or sooner if needed.  Alicia Lambert is scheduled for an elective RIGHT TOTAL KNEE ARTHROPLASTY on 05/13/2021 with Dr. Hessie Knows, MD.  Given patient's past medical history significant for cardiovascular diagnoses, presurgical cardiac clearance was sought by the performing surgeon's office and PAT team. Per cardiology, "this patient is optimized for surgery and may  proceed with the planned procedural course with an  ACCEPTABLE risk of significant perioperative cardiovascular complications".  In review of her medication reconciliation, it is noted that patient is currently on daily antiplatelet therapy.  She has been instructed recommendations from her cardiologist and surgeon for holding her aspirin for 7 days prior to her procedure with plans to restart as soon as postoperative bleeding risk felt to be minimized by her primary attending surgeon.  The patient is aware that her last dose of ASA will be on 05/05/2021.  Patient denies previous perioperative complications with anesthesia in the past. In review of the available records, it is noted that patient underwent a MAC anesthetic course here (ASA III) in 12/2014 without documented complications.   Vitals with BMI 05/03/2021 07/08/2019 08/23/2016  Height _0  _1  -  Weight 158 lbs 1 oz 161 lbs 6 oz -  BMI 93.81 01.7 -  Systolic 510 258 527  Diastolic 71 82 76  Pulse 54 58 60    Providers/Specialists:   NOTE: Primary physician provider listed below. Patient may have been seen by APP or partner within same practice.   PROVIDER ROLE / SPECIALTY LAST Fabio Bering, MD Orthopedics (Surgeon) 05/05/2021  [place] Pink, MD Primary Care Provider 09/22/2020  Katrine Coho, MD Cardiology 04/22/2021  Gurney Maxin, MD Neurology 12/30/2020   Allergies:  Atorvastatin, Methotrexate sodium, Nsaids, Risedronate, Tolmetin, Celecoxib, and Ibuprofen  Current Home Medications:   No current facility-administered medications for this encounter.    acetaminophen (TYLENOL) 650 MG CR tablet   aspirin 81 MG tablet   gabapentin (NEURONTIN) 100 MG capsule   loratadine (CLARITIN) 10 MG tablet   metoprolol tartrate (LOPRESSOR) 25 MG tablet   Multiple Vitamins-Minerals (MULTIVITAMIN WITH MINERALS) tablet   vitamin B-12 (CYANOCOBALAMIN) 1000 MCG tablet   History:   Past Medical History:  Diagnosis Date    Anginal pain (HCC)    B12 deficiency    Basal cell carcinoma (BCC) of skin of nose    Bilateral cataracts    a.) s/p extraction   Bilateral occipital neuralgia    Chondrocalcinosis    Coronary artery disease 07/21/2016   a.) LHC 07/21/2016: EF 55-65%; LVEDP norm; 25% pLCx; no intervention.   Diastolic dysfunction 78/24/2353   a.) TTE 03/17/2017: EF 45%; mild LVH, mod LA enlargement, global HK, G1DD. b.) TTE 12/12/2017: EF 45%; global HK, mild LVH adn LA enlargement; triv PR, mild AR/MR, mod TR; G1DD. c.) TTE 05/13/2019: EF 45%; mild LVH, mild LA enlargement; triv PR, mild MR/TR, mod AR; G1DD.   GERD (gastroesophageal reflux disease)    Headache disorder    HLD (hyperlipidemia)    Hypertension    Memory difficulty    Osteoarthritis    Palpitations    Pulmonary HTN ([place]) 03/17/2017   a.) TTE 03/17/2017: EF 45%; RVSP 36.9 mmHg. b.) TTE 12/15/2017: EF 45%; RVSP 40.3 mmHg. c.) TTE 05/13/2019; EF 45%; EF 45%; RVSP 42.0 mmHg   Rheumatoid arthritis [facility])    Past Surgical History:  Procedure Laterality Date   BACK SURGERY  1993   Lower   CATARACT EXTRACTION Right    CATARACT EXTRACTION W/PHACO Left 01/26/2015   Procedure: CATARACT EXTRACTION PHACO AND INTRAOCULAR LENS PLACEMENT ([place]);  Surgeon: Estill Cotta, MD;  Location: [facility] ORS;  Service: Ophthalmology;  Laterality: Left;  [place] 01:49 AP% 24.2 CDE 45.30 fluid pack lot # [id number]   CTR     LEFT HEART CATH AND CORONARY ANGIOGRAPHY N/A 07/21/2016   Procedure: Left Heart Cath and Coronary Angiography;  Surgeon: Lujean Amel  D, MD;  Location: [place] [facility];  Service: Cardiovascular;  Laterality: N/A;   Family History  Problem Relation Age of Onset   CAD Father    CAD Son    Social History   Tobacco Use   Smoking status: Never   Smokeless tobacco: Never  Vaping Use   Vaping Use: Never used  Substance Use Topics   Alcohol use: No   Drug use: Never    Pertinent Clinical Results:  LABS: Labs reviewed: Acceptable  for surgery.  Component Date Value Ref Range Status   MRSA, PCR 05/03/2021 NEGATIVE  NEGATIVE Final   Staphylococcus aureus 05/03/2021 NEGATIVE  NEGATIVE Final   Comment: (NOTE) The Xpert SA Assay (FDA approved for NASAL specimens in patients 81 years of age and older), is one component of a comprehensive surveillance program. It is not intended to diagnose infection nor to guide or monitor treatment. Performed at [facility], [address] [place], [place] [address]   WBC 05/03/2021 6.7  4.0 - 10.5 K/uL Final   RBC 05/03/2021 3.94  3.87 - 5.11 MIL/uL Final   Hemoglobin 05/03/2021 12.4  12.0 - 15.0 g/dL Final   HCT 05/03/2021 38.9  36.0 - 46.0 % Final   MCV 05/03/2021 98.7  80.0 - 100.0 fL Final   MCH 05/03/2021 31.5  26.0 - 34.0 pg Final   MCHC 05/03/2021 31.9  30.0 - 36.0 g/dL Final   RDW 05/03/2021 12.5  11.5 - 15.5 % Final   Platelets 05/03/2021 211  150 - 400 K/uL Final   nRBC 05/03/2021 0.0  0.0 - 0.2 % Final   Neutrophils Relative % 05/03/2021 50  % Final   Neutro Abs 05/03/2021 3.4  1.7 - 7.7 K/uL Final   Lymphocytes Relative 05/03/2021 38  % Final   Lymphs Abs 05/03/2021 2.5  0.7 - 4.0 K/uL Final   Monocytes Relative 05/03/2021 9  % Final   Monocytes Absolute 05/03/2021 0.6  0.1 - 1.0 K/uL Final   Eosinophils Relative 05/03/2021 2  % Final   Eosinophils Absolute 05/03/2021 0.2  0.0 - 0.5 K/uL Final   Basophils Relative 05/03/2021 1  % Final   Basophils Absolute 05/03/2021 0.1  0.0 - 0.1 K/uL Final   Immature Granulocytes 05/03/2021 0  % Final   Abs Immature Granulocytes 05/03/2021 0.01  0.00 - 0.07 K/uL Final   Performed at [facility], [place], [place] [address]   Sodium 05/03/2021 138  135 - 145 mmol/L Final   Potassium 05/03/2021 3.9  3.5 - 5.1 mmol/L Final   Chloride 05/03/2021 105  98 - 111 mmol/L Final   CO2 05/03/2021 27  22 - 32 mmol/L Final   Glucose, Bld 05/03/2021 94  70 - 99 mg/dL Final   Glucose reference range applies  only to samples taken after fasting for at least 8 hours.   BUN 05/03/2021 28 (H)  8 - 23 mg/dL Final   Creatinine, Ser 05/03/2021 1.04 (H)  0.44 - 1.00 mg/dL Final   Calcium 05/03/2021 9.7  8.9 - 10.3 mg/dL Final   Total Protein 05/03/2021 7.3  6.5 - 8.1 g/dL Final   Albumin 05/03/2021 4.0  3.5 - 5.0 g/dL Final   AST 05/03/2021 24  15 - 41 U/L Final   ALT 05/03/2021 12  0 - 44 U/L Final   Alkaline Phosphatase 05/03/2021 79  38 - 126 U/L Final   Total Bilirubin 05/03/2021 0.5  0.3 - 1.2 mg/dL Final  GFR, Estimated 05/03/2021 51 (L)  >60 mL/min Final   Comment: (NOTE) Calculated using the CKD-EPI Creatinine Equation (2021)   Anion gap 05/03/2021 6  5 - 15 Final   Performed at [facility] [place], [place] [address]   Color, Urine 05/03/2021 YELLOW (A)  YELLOW Final   APPearance 05/03/2021 HAZY (A)  CLEAR Final   Specific Gravity, Urine 05/03/2021 1.017  1.005 - 1.030 Final   pH 05/03/2021 5.0  5.0 - 8.0 Final   Glucose, UA 05/03/2021 NEGATIVE  NEGATIVE mg/dL Final   Hgb urine dipstick 05/03/2021 NEGATIVE  NEGATIVE Final   Bilirubin Urine 05/03/2021 NEGATIVE  NEGATIVE Final   Ketones, ur 05/03/2021 NEGATIVE  NEGATIVE mg/dL Final   Protein, ur 05/03/2021 NEGATIVE  NEGATIVE mg/dL Final   Nitrite 05/03/2021 NEGATIVE  NEGATIVE Final   Leukocytes,Ua 05/03/2021 SMALL (A)  NEGATIVE Final   RBC / HPF 05/03/2021 0-5  0 - 5 RBC/hpf Final   WBC, UA 05/03/2021 0-5  0 - 5 WBC/hpf Final   Bacteria, UA 05/03/2021 RARE (A)  NONE SEEN Final   Squamous Epithelial / LPF 05/03/2021 0-5  0 - 5 Final   Mucus 05/03/2021 PRESENT   Final   Hyaline Casts, UA 05/03/2021 PRESENT   Final   Performed at [facility], [address] [place], [place] [address]   ABO/RH(D) 05/03/2021 O NEG   Final   Antibody Screen 05/03/2021 NEG   Final   Sample Expiration 05/03/2021 05/17/2021,2359   Final   Extend sample reason 05/03/2021    Final                   Value:NO TRANSFUSIONS OR  PREGNANCY IN THE PAST 3 MONTHS Performed at [facility], [address] [place], [place] [address]    ECG: Date: 04/22/2021 Rate: 51 bpm Rhythm: sinus bradycardia; LBBB Axis (leads I and aVF): Left axis deviation Intervals: PR 1364 ms. QRS 436 ms. QTc 436 ms. ST segment and T wave changes: No evidence of acute ST segment elevation or depression Comparison: Similar to previous tracing obtained on 11/27/2017 NOTE: Tracing obtained at [facility]; unable for review. Above based on cardiologist's interpretation.    IMAGING / PROCEDURES: CT RIGHT KNEE WITHOUT CONTRAST performed on 03/25/2021 Mild tricompartmental osteoarthritis of the RIGHT knee Peripheral vascular atherosclerotic disease  LEFT HEART CATHETERIZATION AND CORONARY ANGIOGRAPHY performed on 07/21/2016 LVEF 55 to 65% LVEDP normal 25% stenosis of the proximal LCx Recommendations - continue conservative medical therapy  TRANSTHORACIC ECHOCARDIOGRAM performed on 05/13/2019 LVEF 45% Mild concentric LVH Diastolic Doppler parameters consistent with abnormal relaxation (G1DD) Left atrium mildly enlarged Trivial PR Mild MR and TR Moderate AR Mild pulmonary hypertension; RVSP = 42 mmHg No pericardial effusion  LONG TERM CARDIAC EVENT MONITOR STUDY performed on 12/06/2017 Predominant underlying sinus rhythm with sinus bradycardia with an average heart rate of 78 bpm; range 50-137 bpm Frequent PVCs (23,039) making up 10% of the heartbeats No sustained runs No pauses Rare PACs Artifact was present  Impression and Plan:  Alicia Lambert has been referred for pre-anesthesia review and clearance prior to her undergoing the planned anesthetic and procedural courses. Available labs, pertinent testing, and imaging results were personally reviewed by me. This patient has been appropriately cleared by cardiology with an overall ACCEPTABLE risk of significant perioperative cardiovascular complications.  Based on  clinical review performed today (05/07/21), barring any significant acute changes in the patient's overall condition, it is anticipated that she will  be able to proceed with the planned surgical intervention. Any acute changes in clinical condition may necessitate her procedure being postponed and/or cancelled. Patient will meet with anesthesia team (MD and/or CRNA) on the day of her procedure for preoperative evaluation/assessment. Questions regarding anesthetic course will be fielded at that time.   Pre-surgical instructions were reviewed with the patient during her PAT appointment and questions were fielded by PAT clinical staff. Patient was advised that if any questions or concerns arise prior to her procedure then she should return a call to PAT and/or her surgeon's office to discuss.  Alicia Loh, MSN, APRN, FNP-C, CEN [facility]  Peri-operative Services Nurse Practitioner Phone: [phone] Fax: [phone] 05/07/21 9:21 AM  NOTE: This note has been prepared using Dragon dictation software. Despite my best ability to proofread, there is always the potential that unintentional transcriptional errors may still occur from this process.

## 2021-05-11 ENCOUNTER — Other Ambulatory Visit: Payer: Self-pay

## 2021-05-11 ENCOUNTER — Other Ambulatory Visit
Admission: RE | Admit: 2021-05-11 | Discharge: 2021-05-11 | Disposition: A | Discharge status: Home / Self Care | Payer: Medicare HMO | Source: Ambulatory Visit | Attending: Orthopedic Surgery | Admitting: Orthopedic Surgery

## 2021-05-11 DIAGNOSIS — Z01812 Encounter for preprocedural laboratory examination: Secondary | ICD-10-CM | POA: Insufficient documentation

## 2021-05-11 DIAGNOSIS — Z20822 Contact with and (suspected) exposure to covid-19: Secondary | ICD-10-CM | POA: Insufficient documentation

## 2021-05-11 LAB — SARS CORONAVIRUS 2 (TAT 6-24 HRS): SARS Coronavirus 2: NEGATIVE

## 2021-05-12 MED ORDER — LACTATED RINGERS IV SOLN: INTRAVENOUS | Status: DC

## 2021-05-12 MED ORDER — CEFAZOLIN SODIUM-DEXTROSE 2-4 GM/100ML-% IV SOLN
2.0000 g | INTRAVENOUS | Status: AC
  Administered 2021-05-13: 2 g via INTRAVENOUS

## 2021-05-12 MED ORDER — CHLORHEXIDINE GLUCONATE 0.12 % MT SOLN: 15.0000 mL | Freq: Once | OROMUCOSAL | Status: AC

## 2021-05-12 MED ORDER — FAMOTIDINE 20 MG PO TABS
20.0000 mg | ORAL_TABLET | Freq: Once | ORAL | Status: AC
Start: 2021-05-12 — End: 2021-05-13

## 2021-05-12 MED ORDER — ORAL CARE MOUTH RINSE: 15.0000 mL | Freq: Once | OROMUCOSAL | Status: AC

## 2021-05-13 ENCOUNTER — Other Ambulatory Visit: Payer: Self-pay

## 2021-05-13 ENCOUNTER — Inpatient Hospital Stay
Admission: RE | Admit: 2021-05-13 | Discharge: 2021-05-20 | DRG: 470 | Disposition: A | Discharge status: Skilled Nursing Facility | Payer: Medicare HMO | Attending: Orthopedic Surgery | Admitting: Orthopedic Surgery

## 2021-05-13 ENCOUNTER — Encounter: Payer: Self-pay | Admitting: Orthopedic Surgery

## 2021-05-13 ENCOUNTER — Ambulatory Visit: Payer: Medicare HMO | Admitting: Urgent Care

## 2021-05-13 ENCOUNTER — Encounter
Admission: RE | Disposition: A | Discharge status: Skilled Nursing Facility | Payer: Self-pay | Source: Home / Self Care | Attending: Orthopedic Surgery

## 2021-05-13 ENCOUNTER — Observation Stay: Payer: Medicare HMO

## 2021-05-13 DIAGNOSIS — M1711 Unilateral primary osteoarthritis, right knee: Principal | ICD-10-CM | POA: Diagnosis present

## 2021-05-13 DIAGNOSIS — Z886 Allergy status to analgesic agent status: Secondary | ICD-10-CM

## 2021-05-13 DIAGNOSIS — G8918 Other acute postprocedural pain: Secondary | ICD-10-CM

## 2021-05-13 DIAGNOSIS — Z7982 Long term (current) use of aspirin: Secondary | ICD-10-CM

## 2021-05-13 DIAGNOSIS — Z8249 Family history of ischemic heart disease and other diseases of the circulatory system: Secondary | ICD-10-CM

## 2021-05-13 DIAGNOSIS — I10 Essential (primary) hypertension: Secondary | ICD-10-CM | POA: Diagnosis present

## 2021-05-13 DIAGNOSIS — E785 Hyperlipidemia, unspecified: Secondary | ICD-10-CM | POA: Diagnosis present

## 2021-05-13 DIAGNOSIS — I272 Pulmonary hypertension, unspecified: Secondary | ICD-10-CM | POA: Diagnosis present

## 2021-05-13 DIAGNOSIS — D62 Acute posthemorrhagic anemia: Secondary | ICD-10-CM | POA: Diagnosis not present

## 2021-05-13 DIAGNOSIS — I447 Left bundle-branch block, unspecified: Secondary | ICD-10-CM | POA: Diagnosis present

## 2021-05-13 DIAGNOSIS — I739 Peripheral vascular disease, unspecified: Secondary | ICD-10-CM | POA: Diagnosis present

## 2021-05-13 DIAGNOSIS — Z85828 Personal history of other malignant neoplasm of skin: Secondary | ICD-10-CM

## 2021-05-13 DIAGNOSIS — Z66 Do not resuscitate: Secondary | ICD-10-CM | POA: Diagnosis present

## 2021-05-13 DIAGNOSIS — M21061 Valgus deformity, not elsewhere classified, right knee: Secondary | ICD-10-CM | POA: Diagnosis present

## 2021-05-13 DIAGNOSIS — M11261 Other chondrocalcinosis, right knee: Secondary | ICD-10-CM | POA: Diagnosis present

## 2021-05-13 DIAGNOSIS — Z96651 Presence of right artificial knee joint: Principal | ICD-10-CM

## 2021-05-13 DIAGNOSIS — Z79899 Other long term (current) drug therapy: Secondary | ICD-10-CM

## 2021-05-13 DIAGNOSIS — Z888 Allergy status to other drugs, medicaments and biological substances status: Secondary | ICD-10-CM

## 2021-05-13 DIAGNOSIS — M069 Rheumatoid arthritis, unspecified: Secondary | ICD-10-CM | POA: Diagnosis present

## 2021-05-13 DIAGNOSIS — I700 Atherosclerosis of aorta: Secondary | ICD-10-CM | POA: Diagnosis present

## 2021-05-13 DIAGNOSIS — I2510 Atherosclerotic heart disease of native coronary artery without angina pectoris: Secondary | ICD-10-CM | POA: Diagnosis present

## 2021-05-13 DIAGNOSIS — E538 Deficiency of other specified B group vitamins: Secondary | ICD-10-CM | POA: Diagnosis present

## 2021-05-13 DIAGNOSIS — Z961 Presence of intraocular lens: Secondary | ICD-10-CM | POA: Diagnosis present

## 2021-05-13 DIAGNOSIS — Z20822 Contact with and (suspected) exposure to covid-19: Secondary | ICD-10-CM | POA: Diagnosis present

## 2021-05-13 HISTORY — DX: Angina pectoris, unspecified: I20.9

## 2021-05-13 HISTORY — DX: Unspecified cataract: H26.9

## 2021-05-13 HISTORY — DX: Hyperlipidemia, unspecified: E78.5

## 2021-05-13 HISTORY — DX: Peripheral vascular disease, unspecified: I73.9

## 2021-05-13 HISTORY — DX: Left bundle-branch block, unspecified: I44.7

## 2021-05-13 HISTORY — DX: Deficiency of other specified B group vitamins: E53.8

## 2021-05-13 HISTORY — DX: Unspecified osteoarthritis, unspecified site: M19.90

## 2021-05-13 HISTORY — DX: Palpitations: R00.2

## 2021-05-13 HISTORY — DX: Atherosclerosis of aorta: I70.0

## 2021-05-13 HISTORY — DX: Headache, unspecified: R51.9

## 2021-05-13 HISTORY — DX: Other amnesia: R41.3

## 2021-05-13 HISTORY — DX: Other chondrocalcinosis, unspecified site: M11.20

## 2021-05-13 HISTORY — DX: Basal cell carcinoma of skin of nose: C44.311

## 2021-05-13 HISTORY — PX: TOTAL KNEE ARTHROPLASTY: SHX125

## 2021-05-13 HISTORY — DX: Occipital neuralgia: M54.81

## 2021-05-13 LAB — CBC
HCT: 29.2 % — ABNORMAL LOW (ref 36.0–46.0)
Hemoglobin: 9.5 g/dL — ABNORMAL LOW (ref 12.0–15.0)
MCH: 31.7 pg (ref 26.0–34.0)
MCHC: 32.5 g/dL (ref 30.0–36.0)
MCV: 97.3 fL (ref 80.0–100.0)
Platelets: 162 10*3/uL (ref 150–400)
RBC: 3 MIL/uL — ABNORMAL LOW (ref 3.87–5.11)
RDW: 12.8 % (ref 11.5–15.5)
WBC: 5.5 10*3/uL (ref 4.0–10.5)
nRBC: 0 % (ref 0.0–0.2)

## 2021-05-13 LAB — CREATININE, SERUM
Creatinine, Ser: 0.76 mg/dL (ref 0.44–1.00)
GFR, Estimated: >60 mL/min (ref >60)

## 2021-05-13 LAB — ABO/RH: ABO/RH(D): O NEG

## 2021-05-13 SURGERY — ARTHROPLASTY, KNEE, TOTAL
Anesthesia: Spinal | Site: Knee | Laterality: Right

## 2021-05-13 MED ORDER — OXYCODONE HCL 5 MG PO TABS: 5.0000 mg | ORAL_TABLET | Freq: Once | ORAL | Status: DC | PRN

## 2021-05-13 MED ORDER — CEFAZOLIN SODIUM-DEXTROSE 2-4 GM/100ML-% IV SOLN
INTRAVENOUS | Status: AC
  Filled 2021-05-13: qty 100

## 2021-05-13 MED ORDER — BISACODYL 5 MG PO TBEC
5.0000 mg | DELAYED_RELEASE_TABLET | Freq: Every day | ORAL | Status: DC | PRN
  Administered 2021-05-14 – 2021-05-16 (×2): 5 mg via ORAL
  Filled 2021-05-13 (×2): qty 1

## 2021-05-13 MED ORDER — FENTANYL CITRATE (PF) 100 MCG/2ML IJ SOLN
INTRAMUSCULAR | Status: AC
  Filled 2021-05-13: qty 2

## 2021-05-13 MED ORDER — LORATADINE 10 MG PO TABS: 10.0000 mg | ORAL_TABLET | Freq: Every day | ORAL | Status: DC | PRN

## 2021-05-13 MED ORDER — PANTOPRAZOLE SODIUM 40 MG PO TBEC
40.0000 mg | DELAYED_RELEASE_TABLET | Freq: Every day | ORAL | Status: DC
  Administered 2021-05-13 – 2021-05-20 (×8): 40 mg via ORAL
  Filled 2021-05-13 (×8): qty 1

## 2021-05-13 MED ORDER — MORPHINE SULFATE (PF) 2 MG/ML IV SOLN
0.5000 mg | INTRAVENOUS | Status: DC | PRN
  Administered 2021-05-13 (×2): 1 mg via INTRAVENOUS
  Filled 2021-05-13 (×3): qty 1

## 2021-05-13 MED ORDER — ENOXAPARIN SODIUM 30 MG/0.3ML IJ SOSY
30.0000 mg | PREFILLED_SYRINGE | Freq: Two times a day (BID) | INTRAMUSCULAR | Status: DC
  Administered 2021-05-14 – 2021-05-20 (×13): 30 mg via SUBCUTANEOUS
  Filled 2021-05-13 (×13): qty 0.3

## 2021-05-13 MED ORDER — PROPOFOL 500 MG/50ML IV EMUL
INTRAVENOUS | Status: DC | PRN
  Administered 2021-05-13: 75 ug/kg/min via INTRAVENOUS

## 2021-05-13 MED ORDER — ASPIRIN EC 81 MG PO TBEC
81.0000 mg | DELAYED_RELEASE_TABLET | Freq: Every day | ORAL | Status: DC
  Administered 2021-05-13 – 2021-05-20 (×7): 81 mg via ORAL
  Filled 2021-05-13 (×8): qty 1

## 2021-05-13 MED ORDER — PHENYLEPHRINE HCL-NACL 20-0.9 MG/250ML-% IV SOLN
INTRAVENOUS | Status: DC | PRN
  Administered 2021-05-13: 30 ug/min via INTRAVENOUS

## 2021-05-13 MED ORDER — CEFAZOLIN SODIUM-DEXTROSE 1-4 GM/50ML-% IV SOLN
1.0000 g | Freq: Four times a day (QID) | INTRAVENOUS | Status: AC
  Administered 2021-05-13 (×2): 1 g via INTRAVENOUS
  Filled 2021-05-13 (×2): qty 50

## 2021-05-13 MED ORDER — GABAPENTIN 100 MG PO CAPS
200.0000 mg | ORAL_CAPSULE | Freq: Every day | ORAL | Status: DC
  Administered 2021-05-13 – 2021-05-19 (×7): 200 mg via ORAL
  Filled 2021-05-13 (×7): qty 2

## 2021-05-13 MED ORDER — ONDANSETRON HCL 4 MG PO TABS: 4.0000 mg | ORAL_TABLET | Freq: Four times a day (QID) | ORAL | Status: DC | PRN

## 2021-05-13 MED ORDER — METOCLOPRAMIDE HCL 5 MG/ML IJ SOLN: 5.0000 mg | Freq: Three times a day (TID) | INTRAMUSCULAR | Status: DC | PRN

## 2021-05-13 MED ORDER — HYDROCODONE-ACETAMINOPHEN 7.5-325 MG PO TABS
1.0000 | ORAL_TABLET | ORAL | Status: DC | PRN
  Administered 2021-05-13 – 2021-05-14 (×2): 2 via ORAL
  Administered 2021-05-14 – 2021-05-17 (×5): 1 via ORAL
  Administered 2021-05-18: 2 via ORAL
  Administered 2021-05-19 – 2021-05-20 (×2): 1 via ORAL
  Filled 2021-05-13 (×3): qty 1
  Filled 2021-05-13: qty 2
  Filled 2021-05-13: qty 1
  Filled 2021-05-13: qty 2
  Filled 2021-05-13: qty 1
  Filled 2021-05-13: qty 2
  Filled 2021-05-13 (×2): qty 1

## 2021-05-13 MED ORDER — DROPERIDOL 2.5 MG/ML IJ SOLN
0.6250 mg | Freq: Once | INTRAMUSCULAR | Status: DC | PRN
  Filled 2021-05-13: qty 2

## 2021-05-13 MED ORDER — FENTANYL CITRATE (PF) 100 MCG/2ML IJ SOLN: 25.0000 ug | INTRAMUSCULAR | Status: DC | PRN

## 2021-05-13 MED ORDER — MENTHOL 3 MG MT LOZG
1.0000 | LOZENGE | OROMUCOSAL | Status: DC | PRN
  Filled 2021-05-13 (×2): qty 9

## 2021-05-13 MED ORDER — MAGNESIUM HYDROXIDE 400 MG/5ML PO SUSP
30.0000 mL | Freq: Every day | ORAL | Status: DC
  Administered 2021-05-13 – 2021-05-19 (×7): 30 mL via ORAL
  Filled 2021-05-13 (×7): qty 30

## 2021-05-13 MED ORDER — SODIUM CHLORIDE 0.9 % IR SOLN
Status: DC | PRN
  Administered 2021-05-13: 3000 mL via SURGICAL_CAVITY

## 2021-05-13 MED ORDER — DOCUSATE SODIUM 100 MG PO CAPS
100.0000 mg | ORAL_CAPSULE | Freq: Two times a day (BID) | ORAL | Status: DC
  Administered 2021-05-13 – 2021-05-20 (×15): 100 mg via ORAL
  Filled 2021-05-13 (×15): qty 1

## 2021-05-13 MED ORDER — CHLORHEXIDINE GLUCONATE 0.12 % MT SOLN
OROMUCOSAL | Status: AC
  Administered 2021-05-13: 15 mL via OROMUCOSAL
  Filled 2021-05-13: qty 15

## 2021-05-13 MED ORDER — PROPOFOL 1000 MG/100ML IV EMUL
INTRAVENOUS | Status: AC
  Filled 2021-05-13: qty 100

## 2021-05-13 MED ORDER — SODIUM CHLORIDE 0.9 % IV SOLN: INTRAVENOUS | Status: DC

## 2021-05-13 MED ORDER — ACETAMINOPHEN 10 MG/ML IV SOLN
INTRAVENOUS | Status: AC
  Filled 2021-05-13: qty 100

## 2021-05-13 MED ORDER — VITAMIN B-12 1000 MCG PO TABS
1000.0000 ug | ORAL_TABLET | Freq: Every day | ORAL | Status: DC
  Administered 2021-05-15 – 2021-05-20 (×6): 1000 ug via ORAL
  Filled 2021-05-13 (×7): qty 1

## 2021-05-13 MED ORDER — GABAPENTIN 100 MG PO CAPS: 100.0000 mg | ORAL_CAPSULE | ORAL | Status: DC

## 2021-05-13 MED ORDER — METOCLOPRAMIDE HCL 10 MG PO TABS
5.0000 mg | ORAL_TABLET | Freq: Three times a day (TID) | ORAL | Status: DC | PRN
  Administered 2021-05-14: 10 mg via ORAL
  Filled 2021-05-13: qty 1

## 2021-05-13 MED ORDER — GLYCOPYRROLATE 0.2 MG/ML IJ SOLN
INTRAMUSCULAR | Status: DC | PRN
  Administered 2021-05-13: .2 mg via INTRAVENOUS

## 2021-05-13 MED ORDER — POLYETHYLENE GLYCOL 3350 17 G PO PACK
17.0000 g | PACK | Freq: Every day | ORAL | Status: DC | PRN
  Administered 2021-05-16: 17 g via ORAL
  Filled 2021-05-13: qty 1

## 2021-05-13 MED ORDER — BUPIVACAINE HCL (PF) 0.5 % IJ SOLN
INTRAMUSCULAR | Status: AC
  Filled 2021-05-13: qty 10

## 2021-05-13 MED ORDER — ONDANSETRON HCL 4 MG/2ML IJ SOLN
4.0000 mg | Freq: Four times a day (QID) | INTRAMUSCULAR | Status: DC | PRN
  Administered 2021-05-14 – 2021-05-20 (×6): 4 mg via INTRAVENOUS
  Filled 2021-05-13 (×7): qty 2

## 2021-05-13 MED ORDER — SODIUM CHLORIDE (PF) 0.9 % IJ SOLN
INTRAMUSCULAR | Status: DC | PRN
  Administered 2021-05-13: 91 mL via INTRAMUSCULAR

## 2021-05-13 MED ORDER — PRONTOSAN WOUND IRRIGATION OPTIME
TOPICAL | Status: DC | PRN
  Administered 2021-05-13: 1

## 2021-05-13 MED ORDER — ALUM & MAG HYDROXIDE-SIMETH 200-200-20 MG/5ML PO SUSP: 30.0000 mL | ORAL | Status: DC | PRN

## 2021-05-13 MED ORDER — PHENOL 1.4 % MT LIQD
1.0000 | OROMUCOSAL | Status: DC | PRN
  Filled 2021-05-13: qty 177

## 2021-05-13 MED ORDER — PHENYLEPHRINE HCL-NACL 20-0.9 MG/250ML-% IV SOLN
INTRAVENOUS | Status: AC
  Filled 2021-05-13: qty 500

## 2021-05-13 MED ORDER — ZOLPIDEM TARTRATE 5 MG PO TABS
5.0000 mg | ORAL_TABLET | Freq: Every evening | ORAL | Status: DC | PRN
  Administered 2021-05-18 – 2021-05-19 (×2): 5 mg via ORAL
  Filled 2021-05-13 (×3): qty 1

## 2021-05-13 MED ORDER — ACETAMINOPHEN 10 MG/ML IV SOLN: 1000.0000 mg | Freq: Once | INTRAVENOUS | Status: DC | PRN

## 2021-05-13 MED ORDER — HYDROCODONE-ACETAMINOPHEN 5-325 MG PO TABS
1.0000 | ORAL_TABLET | ORAL | Status: DC | PRN
  Administered 2021-05-13 – 2021-05-15 (×3): 2 via ORAL
  Administered 2021-05-19 (×2): 1 via ORAL
  Filled 2021-05-13: qty 2
  Filled 2021-05-13 (×2): qty 1
  Filled 2021-05-13 (×3): qty 2

## 2021-05-13 MED ORDER — FENTANYL CITRATE (PF) 100 MCG/2ML IJ SOLN
INTRAMUSCULAR | Status: DC | PRN
  Administered 2021-05-13: 25 ug via INTRAVENOUS

## 2021-05-13 MED ORDER — ACETAMINOPHEN 325 MG PO TABS
325.0000 mg | ORAL_TABLET | Freq: Four times a day (QID) | ORAL | Status: DC | PRN
  Administered 2021-05-17: 650 mg via ORAL
  Administered 2021-05-18: 325 mg via ORAL
  Filled 2021-05-13: qty 2
  Filled 2021-05-13: qty 1

## 2021-05-13 MED ORDER — MIDAZOLAM HCL 2 MG/2ML IJ SOLN
INTRAMUSCULAR | Status: AC
  Filled 2021-05-13: qty 2

## 2021-05-13 MED ORDER — SODIUM CHLORIDE 0.9 % IR SOLN
Status: DC | PRN
  Administered 2021-05-13: 1600 mL via SURGICAL_CAVITY

## 2021-05-13 MED ORDER — OXYCODONE HCL 5 MG/5ML PO SOLN: 5.0000 mg | Freq: Once | ORAL | Status: DC | PRN

## 2021-05-13 MED ORDER — FAMOTIDINE 20 MG PO TABS
ORAL_TABLET | ORAL | Status: AC
  Administered 2021-05-13: 20 mg via ORAL
  Filled 2021-05-13: qty 1

## 2021-05-13 MED ORDER — ACETAMINOPHEN 10 MG/ML IV SOLN
INTRAVENOUS | Status: DC | PRN
  Administered 2021-05-13: 1000 mg via INTRAVENOUS

## 2021-05-13 MED ORDER — MIDAZOLAM HCL 5 MG/5ML IJ SOLN
INTRAMUSCULAR | Status: DC | PRN
Start: 2021-05-13 — End: 2021-05-13
  Administered 2021-05-13: 1 mg via INTRAVENOUS

## 2021-05-13 MED ORDER — METOPROLOL TARTRATE 25 MG PO TABS
25.0000 mg | ORAL_TABLET | Freq: Two times a day (BID) | ORAL | Status: DC
  Administered 2021-05-13 – 2021-05-20 (×13): 25 mg via ORAL
  Filled 2021-05-13 (×14): qty 1

## 2021-05-13 MED ORDER — GABAPENTIN 100 MG PO CAPS
100.0000 mg | ORAL_CAPSULE | Freq: Every day | ORAL | Status: DC
  Administered 2021-05-15 – 2021-05-20 (×6): 100 mg via ORAL
  Filled 2021-05-13 (×7): qty 1

## 2021-05-13 MED ORDER — METHOCARBAMOL 500 MG PO TABS
500.0000 mg | ORAL_TABLET | Freq: Four times a day (QID) | ORAL | Status: DC | PRN
Start: 2021-05-13 — End: 2021-05-20
  Administered 2021-05-13 – 2021-05-20 (×7): 500 mg via ORAL
  Filled 2021-05-13 (×8): qty 1

## 2021-05-13 MED ORDER — DIPHENHYDRAMINE HCL 12.5 MG/5ML PO ELIX
12.5000 mg | ORAL_SOLUTION | ORAL | Status: DC | PRN
  Administered 2021-05-15: 25 mg via ORAL
  Filled 2021-05-13: qty 10

## 2021-05-13 MED ORDER — ADULT MULTIVITAMIN W/MINERALS CH
1.0000 | ORAL_TABLET | Freq: Every day | ORAL | Status: DC
  Administered 2021-05-15 – 2021-05-20 (×6): 1 via ORAL
  Filled 2021-05-13 (×7): qty 1

## 2021-05-13 MED ORDER — BUPIVACAINE HCL (PF) 0.5 % IJ SOLN
INTRAMUSCULAR | Status: DC | PRN
  Administered 2021-05-13: 2.5 mL

## 2021-05-13 MED ORDER — FLEET ENEMA 7-19 GM/118ML RE ENEM: 1.0000 | ENEMA | Freq: Once | RECTAL | Status: DC | PRN

## 2021-05-13 MED ORDER — METHOCARBAMOL 1000 MG/10ML IJ SOLN
500.0000 mg | Freq: Four times a day (QID) | INTRAVENOUS | Status: DC | PRN
  Filled 2021-05-13: qty 5

## 2021-05-13 MED ORDER — MORPHINE SULFATE (PF) 10 MG/ML IV SOLN
INTRAVENOUS | Status: AC
  Filled 2021-05-13: qty 1

## 2021-05-13 SURGICAL SUPPLY — 72 items
BLADE SAGITTAL 25.0X1.19X90 (BLADE) ×2 IMPLANT
BLADE SAW 90X13X1.19 OSCILLAT (BLADE) ×2 IMPLANT
BLOCK CUTTING FEMUR 4 RT (MISCELLANEOUS) ×1 IMPLANT
BLOCK CUTTING TIBIAL 4 RT (MISCELLANEOUS) ×1 IMPLANT
BLOCK CUTTING TIBIAL 4 RT MIS (MISCELLANEOUS) ×1 IMPLANT
BNDG ELASTIC 6X5.8 VLCR STR LF (GAUZE/BANDAGES/DRESSINGS) ×2 IMPLANT
CANISTER WOUND CARE 500ML ATS (WOUND CARE) ×2 IMPLANT
CEMENT HV SMART SET (Cement) ×4 IMPLANT
CEMENT PATELLA RESURF SZ1 (Cement) ×1 IMPLANT
CHLORAPREP W/TINT 26 (MISCELLANEOUS) ×4 IMPLANT
COOLER POLAR GLACIER W/PUMP (MISCELLANEOUS) ×2 IMPLANT
CUFF TOURN SGL QUICK 24 (TOURNIQUET CUFF)
CUFF TOURN SGL QUICK 34 (TOURNIQUET CUFF)
CUFF TRNQT CYL 24X4X16.5-23 (TOURNIQUET CUFF) IMPLANT
CUFF TRNQT CYL 34X4.125X (TOURNIQUET CUFF) IMPLANT
DRAPE 3/4 80X56 (DRAPES) ×4 IMPLANT
DRSG MEPILEX SACRM 8.7X9.8 (GAUZE/BANDAGES/DRESSINGS) ×2 IMPLANT
ELECT CAUTERY BLADE 6.4 (BLADE) ×2 IMPLANT
ELECT REM PT RETURN 9FT ADLT (ELECTROSURGICAL) ×2
ELECTRODE REM PT RTRN 9FT ADLT (ELECTROSURGICAL) ×1 IMPLANT
FEMUR BONE MODEL (MISCELLANEOUS) ×1 IMPLANT
GAUZE SPONGE 4X4 12PLY STRL (GAUZE/BANDAGES/DRESSINGS) ×2 IMPLANT
GAUZE XEROFORM 1X8 LF (GAUZE/BANDAGES/DRESSINGS) ×1 IMPLANT
GLOVE SURG ORTHO LTX SZ8 (GLOVE) ×2 IMPLANT
GLOVE SURG SYN 9.0  PF PI (GLOVE) ×1
GLOVE SURG SYN 9.0 PF PI (GLOVE) ×1 IMPLANT
GLOVE SURG UNDER LTX SZ8 (GLOVE) ×2 IMPLANT
GLOVE SURG UNDER POLY LF SZ9 (GLOVE) ×2 IMPLANT
GOWN SRG 2XL LVL 4 RGLN SLV (GOWNS) ×1 IMPLANT
GOWN STRL NON-REIN 2XL LVL4 (GOWNS) ×1
GOWN STRL REUS W/ TWL LRG LVL3 (GOWN DISPOSABLE) ×1 IMPLANT
GOWN STRL REUS W/ TWL XL LVL3 (GOWN DISPOSABLE) ×1 IMPLANT
GOWN STRL REUS W/TWL LRG LVL3 (GOWN DISPOSABLE) ×1
GOWN STRL REUS W/TWL XL LVL3 (GOWN DISPOSABLE) ×1
HOLDER FOLEY CATH W/STRAP (MISCELLANEOUS) ×2 IMPLANT
IV NS IRRIG 3000ML ARTHROMATIC (IV SOLUTION) ×2 IMPLANT
KIT PREVENA INCISION MGT20CM45 (CANNISTER) ×2 IMPLANT
KIT TURNOVER KIT A (KITS) ×2 IMPLANT
KNEE FEM COMP 4N 02072214R (Joint) ×1 IMPLANT
KNEE TIBIAL INSERT FXD 14MM S4 (Insert) ×1 IMPLANT
MANIFOLD NEPTUNE II (INSTRUMENTS) ×3 IMPLANT
NDL SAFETY ECLIPSE 18X1.5 (NEEDLE) ×1 IMPLANT
NDL SPNL 18GX3.5 QUINCKE PK (NEEDLE) ×1 IMPLANT
NDL SPNL 20GX3.5 QUINCKE YW (NEEDLE) ×1 IMPLANT
NEEDLE HYPO 18GX1.5 SHARP (NEEDLE) ×1
NEEDLE SPNL 18GX3.5 QUINCKE PK (NEEDLE) IMPLANT
NEEDLE SPNL 20GX3.5 QUINCKE YW (NEEDLE) ×2 IMPLANT
NS IRRIG 1000ML POUR BTL (IV SOLUTION) ×2 IMPLANT
PACK TOTAL KNEE (MISCELLANEOUS) ×2 IMPLANT
PAD WRAPON POLAR KNEE (MISCELLANEOUS) ×1 IMPLANT
PENCIL ELECTRO HAND CTR (MISCELLANEOUS) ×1 IMPLANT
PENCIL SMOKE EVACUATOR COATED (MISCELLANEOUS) ×1 IMPLANT
PULSAVAC PLUS IRRIG FAN TIP (DISPOSABLE) ×2
SCALPEL PROTECTED #10 DISP (BLADE) ×4 IMPLANT
SOLUTION PRONTOSAN WOUND 350ML (IRRIGATION / IRRIGATOR) ×2 IMPLANT
STAPLER SKIN PROX 35W (STAPLE) ×2 IMPLANT
STEM EXTENSION 11MMX30MM (Stem) ×1 IMPLANT
SUCTION FRAZIER HANDLE 10FR (MISCELLANEOUS) ×1
SUCTION TUBE FRAZIER 10FR DISP (MISCELLANEOUS) ×1 IMPLANT
SUT DVC 2 QUILL PDO  T11 36X36 (SUTURE) ×1
SUT DVC 2 QUILL PDO T11 36X36 (SUTURE) ×1 IMPLANT
SUT ETHIBOND 2 V 37 (SUTURE) ×1 IMPLANT
SUT V-LOC 90 ABS DVC 3-0 CL (SUTURE) ×2 IMPLANT
SYR 20ML LL LF (SYRINGE) ×2 IMPLANT
SYR 50ML LL SCALE MARK (SYRINGE) ×4 IMPLANT
TIBIAL TRAY FIXED 4 MEDACTA 02 (Joint) ×1 IMPLANT
TIP FAN IRRIG PULSAVAC PLUS (DISPOSABLE) ×1 IMPLANT
TOWEL OR 17X26 4PK STRL BLUE (TOWEL DISPOSABLE) ×2 IMPLANT
TOWER CARTRIDGE SMART MIX (DISPOSABLE) ×2 IMPLANT
TRAY FOLEY MTR SLVR 16FR STAT (SET/KITS/TRAYS/PACK) ×2 IMPLANT
WATER STERILE IRR 1000ML POUR (IV SOLUTION) ×2 IMPLANT
WRAPON POLAR PAD KNEE (MISCELLANEOUS) ×2

## 2021-05-13 NOTE — Plan of Care (Signed)
?  Problem: Education: ?Goal: Knowledge of General Education information will improve ?Description: Including pain rating scale, medication(s)/side effects and non-pharmacologic comfort measures ?Outcome: Progressing ?  ?Problem: Health Behavior/Discharge Planning: ?Goal: Ability to manage health-related needs will improve ?Outcome: Progressing ?  ?Problem: Clinical Measurements: ?Goal: Will remain free from infection ?Outcome: Progressing ?  ?Problem: Clinical Measurements: ?Goal: Ability to maintain clinical measurements within normal limits will improve ?Outcome: Progressing ?  ?

## 2021-05-13 NOTE — Evaluation (Signed)
Physical Therapy Evaluation ?Patient Details ?Name: Alicia Lambert ?MRN: [id number] ?DOB: [birth year] ?Today's Date: 05/13/2021 ? ?History of Present Illness ? Pt is a 88 y.o. female s/p R TKA 05/13/21 secondary primary localized OA.  PMH includes CAD, LBBB, PVD, aortic atherosclerosis, angina, diastolic dysfunction, pulmonary hypertension, palpitations, HTN, HLD, GERD, OA, RA, memory difficulties, and back surgery.  ?Clinical Impression ? Prior to surgery, pt was independent with functional mobility (use of SPC if felt dizzy or off-balance); lives alone in 1 level home with 2 STE R railing.  Currently pt is SBA semi-supine to sitting edge of bed; min assist with transfers using RW; and min to mod assist to ambulate a few feet bed to recliner with RW use.  Pt tending to lean towards L side to off weight R LE d/t R knee pain requiring assist to steady taking steps bed to recliner with RW use.  Pt reporting 0/10 R knee pain beginning of session but after standing and taking steps to recliner pt reporting 8-9/10 R knee pain and then 10/10 R knee pain (nurse notified and present end of session with pain meds; polar care also applied).  Able to perform R LE SLR independently; R knee flexion AROM to 80 degrees.  Pt would benefit from skilled PT to address noted impairments and functional limitations (see below for any additional details).  Upon hospital discharge, pt would benefit from SNF.   ? ?Recommendations for follow up therapy are one component of a multi-disciplinary discharge planning process, led by the attending physician.  Recommendations may be updated based on patient status, additional functional criteria and insurance authorization. ? ?Follow Up Recommendations Skilled nursing-short term rehab (<3 hours/day) ? ?  ?Assistance Recommended at Discharge Frequent or constant Supervision/Assistance  ?Patient can return home with the following ? A lot of help with walking and/or transfers;A lot of help with  bathing/dressing/bathroom;Assistance with cooking/housework;Assist for transportation;Help with stairs or ramp for entrance ? ?  ?Equipment Recommendations Rolling walker (2 wheels);BSC/3in1  ?Recommendations for Other Services ? OT consult  ?  ?Functional Status Assessment Patient has had a recent decline in their functional status and demonstrates the ability to make significant improvements in function in a reasonable and predictable amount of time.  ? ?  ?Precautions / Restrictions Precautions ?Precautions: Fall ?Precaution Comments: wound vac ?Restrictions ?Weight Bearing Restrictions: Yes ?RLE Weight Bearing: Weight bearing as tolerated  ? ?  ? ?Mobility ? Bed Mobility ?Overal bed mobility: Needs Assistance ?Bed Mobility: Supine to Sit ?  ?  ?Supine to sit: Supervision, HOB elevated ?  ?  ?General bed mobility comments: SBA for lines; increased effort to perform on own ?  ? ?Transfers ?Overall transfer level: Needs assistance ?Equipment used: Rolling walker (2 wheels) ?Transfers: Sit to/from Stand ?Sit to Stand: Min assist ?  ?  ?  ?  ?  ?General transfer comment: vc's for UE/LE placement; minimal assist to stand up to walker and control descent sitting ?  ? ?Ambulation/Gait ?Ambulation/Gait assistance: Min assist, Mod assist ?Gait Distance (Feet): 3 Feet (bed to recliner) ?Assistive device: Rolling walker (2 wheels) ?  ?Gait velocity: decreased ?  ?  ?General Gait Details: pt tending to lean towards L side to offweight R LE d/t R knee pain; assist to steady; vc's to increase UE support through RW to offweight R LE; antalgic ? ?Stairs ?  ?  ?  ?  ?  ? ?Wheelchair Mobility ?  ? ?Modified Rankin (Stroke Patients Only) ?  ? ?  ? ?  Balance Overall balance assessment: Needs assistance ?Sitting-balance support: No upper extremity supported, Feet supported ?Sitting balance-Leahy Scale: Good ?Sitting balance - Comments: steady sitting reaching within BOS ?  ?Standing balance support: Bilateral upper extremity  supported ?Standing balance-Leahy Scale: Fair ?Standing balance comment: steady static standing with B UE support on RW ?  ?  ?  ?  ?  ?  ?  ?  ?  ?  ?  ?   ? ? ? ?Pertinent Vitals/Pain Pain Assessment ?Pain Assessment: 0-10 ?Pain Score: 10-Worst pain ever ?Pain Location: R knee ?Pain Descriptors / Indicators: Aching, Sore, Tender ?Pain Intervention(s): Limited activity within patient's tolerance, Monitored during session, Premedicated before session, Repositioned, Patient requesting pain meds-RN notified, RN gave pain meds during session, Other (comment) (polar care applied) ?HR in 50's bpm during sessions activities; O2 sats WFL on room air.  ? ? ?Home Living Family/patient expects to be discharged to:: Private residence ?Living Arrangements: Alone ?  ?Type of Home: House ?Home Access: Stairs to enter ?Entrance Stairs-Rails: Right ?Entrance Stairs-Number of Steps: 2 ?  ?Home Layout: One level ?Home Equipment: Rollator (4 wheels);Cane - single point (can borrow [facility]) ?   ?  ?Prior Function Prior Level of Function : Independent/Modified Independent ?  ?  ?  ?  ?  ?  ?Mobility Comments: Normally no AD use but will use SPC if feels dizzy or off-balance ?  ?  ? ? ?Hand Dominance  ?   ? ?  ?Extremity/Trunk Assessment  ? Upper Extremity Assessment ?Upper Extremity Assessment: Overall WFL for tasks assessed ?  ? ?Lower Extremity Assessment ?Lower Extremity Assessment: RLE deficits/detail (L LE WFL) ?RLE Deficits / Details: able to perform R LE SLR independently; at least 3/5 hip flexion and ankle DF/PF AROM ?RLE: Unable to fully assess due to pain ?  ? ?Cervical / Trunk Assessment ?Cervical / Trunk Assessment: Other exceptions ?Cervical / Trunk Exceptions: forward head/shoulders  ?Communication  ? Communication: HOH  ?Cognition Arousal/Alertness: Awake/alert ?Behavior During Therapy: [facility] for tasks assessed/performed ?  ?  ?  ?  ?  ?  ?  ?  ?  ?  ?  ?  ?  ?  ?  ?  ?  ?General Comments: Appeared forgetful intermittently (may  be complicated d/t HOH); oriented to at least person, place, and general situation ?  ?  ? ?  ?General Comments  Nursing cleared pt for participation in physical therapy.  Pt agreeable to PT session.  Pt's family present during session. ? ?  ?Exercises Total Joint Exercises ?Ankle Circles/Pumps: AROM, Strengthening, Both, 10 reps, Supine ?Quad Sets: AROM, Strengthening, Both, 10 reps, Supine ?Heel Slides: AAROM, Strengthening, Right, 10 reps, Supine ?Hip ABduction/ADduction: AAROM, Strengthening, Right, 10 reps, Supine ?Straight Leg Raises: AROM, Strengthening, Right, 10 reps, Supine ?Goniometric ROM: R knee AROM 5-80 degrees  ? ?Assessment/Plan  ?  ?PT Assessment Patient needs continued PT services  ?PT Problem List Decreased strength;Decreased range of motion;Decreased activity tolerance;Decreased mobility;Decreased knowledge of use of DME;Decreased knowledge of precautions;Pain;Decreased skin integrity ? ?   ?  ?PT Treatment Interventions DME instruction;Gait training;Stair training;Functional mobility training;Therapeutic activities;Therapeutic exercise;Balance training;Patient/family education   ? ?PT Goals (Current goals can be found in the Care Plan section)  ?Acute Rehab PT Goals ?Patient Stated Goal: to improve mobility ?PT Goal Formulation: With patient/family ?Time For Goal Achievement: 05/27/21 ?Potential to Achieve Goals: Good ? ?  ?Frequency BID ?  ? ? ?Co-evaluation   ?  ?  ?  ?  ? ? ?  ?  AM-PAC PT "6 Clicks" Mobility  ?Outcome Measure Help needed turning from your back to your side while in a flat bed without using bedrails?: None ?Help needed moving from lying on your back to sitting on the side of a flat bed without using bedrails?: A Little ?Help needed moving to and from a bed to a chair (including a wheelchair)?: A Lot ?Help needed standing up from a chair using your arms (e.g., wheelchair or bedside chair)?: A Little ?Help needed to walk in hospital room?: A Lot ?Help needed climbing 3-5 steps  with a railing? : Total ?6 Click Score: 15 ? ?  ?End of Session Equipment Utilized During Treatment: Gait belt ?Activity Tolerance: Patient limited by pain ?Patient left: in chair;with call bell/phone within reach;with

## 2021-05-13 NOTE — Transfer of Care (Signed)
Immediate Anesthesia Transfer of Care Note ? ?Patient: Alicia Lambert ? ?Procedure(s) Performed: TOTAL KNEE ARTHROPLASTY (Right: Knee) ? ?Patient Location: PACU ? ?Anesthesia Type:General and Spinal ? ?Level of Consciousness: awake, alert  and oriented ? ?Airway & Oxygen Therapy: Patient Spontanous Breathing ? ?Post-op Assessment: Report given to RN and Post -op Vital signs reviewed and stable ? ?Post vital signs: Reviewed and stable ? ?Last Vitals:  ?Vitals Value Taken Time  ?BP 102/56 05/13/21 1144  ?Temp    ?Pulse 54 05/13/21 1149  ?Resp 14 05/13/21 1149  ?SpO2 99 % 05/13/21 1149  ?Vitals shown include unvalidated device data. ? ?Last Pain:  ?Vitals:  ? 05/13/21 0917  ?TempSrc: Tympanic  ?   ? ?  ? ?Complications: No notable events documented. ?

## 2021-05-13 NOTE — Plan of Care (Signed)
?  Problem: Education: ?Goal: Knowledge of General Education information will improve ?Description: Including pain rating scale, medication(s)/side effects and non-pharmacologic comfort measures ?05/13/2021 1823 by Derrek Monaco, RN ?Outcome: Progressing ?05/13/2021 1819 by Derrek Monaco, RN ?Outcome: Progressing ?  ?Problem: Clinical Measurements: ?Goal: Ability to maintain clinical measurements within normal limits will improve ?05/13/2021 1823 by Derrek Monaco, RN ?Outcome: Progressing ?05/13/2021 1819 by Derrek Monaco, RN ?Outcome: Progressing ?  ?Problem: Clinical Measurements: ?Goal: Will remain free from infection ?05/13/2021 1823 by Derrek Monaco, RN ?Outcome: Progressing ?05/13/2021 1819 by Derrek Monaco, RN ?Outcome: Progressing ?  ?Problem: Clinical Measurements: ?Goal: Diagnostic test results will improve ?05/13/2021 1823 by Derrek Monaco, RN ?Outcome: Progressing ?05/13/2021 1819 by Derrek Monaco, RN ?Outcome: Progressing ?  ?

## 2021-05-13 NOTE — Op Note (Signed)
05/13/2021 ? ?11:48 AM ? ?PATIENT:  Alicia Lambert  ? ?MRN: [id number] ? ?PRE-OPERATIVE DIAGNOSIS:  Primary localized osteoarthritis of right knee ?  ?POST-OPERATIVE DIAGNOSIS:  Same ?  ?PROCEDURE:  Procedure(s): ?Right TOTAL KNEE ARTHROPLASTY ?  ?SURGEON: Laurene Footman, MD ?  ?ASSISTANTS: Rachelle Hora, PA-C ?  ?ANESTHESIA:   spinal ?  ?EBL: 100 cc ?  ?BLOOD ADMINISTERED:none ?  ?DRAINS:  Incisional wound VAC   ?  ?LOCAL MEDICATIONS USED:  MARCAINE    and OTHER Exparel and morphine ?  ?SPECIMEN:  No Specimen ?  ?DISPOSITION OF SPECIMEN:  N/A ?  ?COUNTS:  YES ?  ?TOURNIQUET: 20 minutes at 300 mm Hg ?  ?IMPLANTS: Medacta  GMK PS system with 4 narrow femur, 4 tibia with short stem and 14 mm insert.  Size 1 patella, all components cemented. ?  ?DICTATION: .Dragon Dictation   patient was brought to the operating room and spinal anesthesia was obtained.  After prepping and draping the right leg in sterile fashion, and after patient identification and timeout procedures were completed, midline skin incision was made followed by medial parapatellar arthrotomy with severe medial compartment osteoarthritis, advanced patellofemoral arthritis and very advanced lateral compartment arthritis, partial synovectomy was also carried out.   The ACL and PCL and fat pad were excised along with anterior horns of the meniscus. The proximal tibia cutting guide from  the [facility] system was applied and the proximal tibia cut carried out.  The distal femoral cut was carried out in a similar fashion     The 4 femoral cutting guide applied with anterior posterior and chamfer cuts made.  The posterior horns of the menisci were removed at this point.   Injection of the above medication was carried out after the femoral and tibial cuts were carried out.  The 4 baseplate trial was placed pinned into position and proximal tibial preparation carried out with drilling hand reaming and the keel punch followed by placement of the 4 narrow femur and  sizing the tibial insert size 14 millimeter gave the best fit with stability and full extension.  The distal femoral drill holes were made in the notch cut for the trochlear groove was then carried out with trials were then removed the patella was cut using the patellar cutting guide and it sized to a size 1 after drill holes have been made  The knee was irrigated with pulsatile lavage and the bony surfaces dried the tibial component was cemented into place first.  Excess cement was removed and the polyethylene insert placed with a torque screw placed with a torque screwdriver tightened.  The distal femoral component was placed and the knee was held in extension as the patellar button was clamped into place.  After the cement was set, excess cement was removed and the knee was again irrigated thoroughly thoroughly irrigated.  The tourniquet was let down and hemostasis checked with electrocautery. The arthrotomy was repaired with a heavy Quill suture,  followed by 3-0 V lock subcuticular closure, skin staples followed by incisional wound VAC and Polar Care.. ?  ?PLAN OF CARE: Admit for overnight observation ?  ?PATIENT DISPOSITION:  PACU - hemodynamically stable. ?  ?  ?   ?  ?  ?  ? ? ?

## 2021-05-13 NOTE — Anesthesia Procedure Notes (Signed)
Spinal ? ?Patient location during procedure: OR ?Start time: 05/13/2021 9:56 AM ?End time: 05/13/2021 10:00 AM ?Reason for block: surgical anesthesia ?Staffing ?Performed: resident/CRNA  ?Anesthesiologist: Iran Ouch, MD ?Resident/CRNA: Debe Coder, CRNA ?Preanesthetic Checklist ?Completed: patient identified, IV checked, site marked, risks and benefits discussed, surgical consent, monitors and equipment checked, pre-op evaluation and timeout performed ?Spinal Block ?Patient position: sitting ?Prep: ChloraPrep ?Patient monitoring: continuous pulse ox and blood pressure ?Approach: midline ?Location: L3-4 ?Injection technique: single-shot ?Needle ?Needle type: Pencan  ?Needle gauge: 24 G ?Needle length: 9 cm ?Assessment ?Sensory level: T4 ?Events: CSF return ? ? ? ?

## 2021-05-13 NOTE — H&P (Signed)
Chief Complaint  ?Patient presents with  ? Pre-op Exam  ?Right TKA scheduled 05/13/21  ? ? ?History of the Present Illness: ?Alicia Lambert is a 88 y.o. female here today for history and physical for right total knee arthroplasty with Alicia Lambert on 05/13/2021. She has advanced right knee osteoarthritis with severe lateral compartment degenerative changes with valgus deformity with extensive chondrocalcinosis and severe patellofemoral osteoarthritis. She has had cortisone and viscosupplementation's in the past with no relief. Patient has a hard time walking secondary to pain. She is typically very active and would like to be more active but is limited by her right knee pain. Patient has seen Alicia Lambert, discussed total knee arthroplasty and agreed and consented the procedure with him. ? ?The patient's primary care physician is Alicia Lambert. She notes she is having memory problems and she sees Alicia Lambert for this issue. ? ?She lives in [place]. The patient is a Geophysical data processor. Alicia Lambert is her point of contact, phone number [phone]. ? ?I have reviewed past medical, surgical, social and family history, and allergies as documented in the EMR. ? ?Past Medical History: ?Past Medical History:  ?Diagnosis Date  ? Hyperlipidemia  ? Hypertension  ? Tachycardia  ? ?Past Surgical History: ?Past Surgical History:  ?Procedure Laterality Date  ? [place]  ? Back Surgery 1992  ?Dr Leotis Pain  ? CARDIAC CATHETERIZATION  ?insignificant disease by cath 5/18  ? Right Carpal Tunnel Release 2003 Dr Derrel Nip  ? Right Eye Surgery due to trama 2001  ? ?Past Family History: ?Family History  ?Problem Relation Age of Onset  ? Heart disease Father  ? Heart disease Mother  ? ?Medications: ?Current Outpatient Medications Ordered in Epic  ?Medication Sig Dispense Refill  ? ACETAMINOPHEN (TYLENOL ARTHRITIS ORAL) Take 1 tablet by mouth as needed Takes 1 tablet throughout the day as needed and 2 tablets at night as  needed.  ? aspirin 81 MG chewable tablet Take 162 mg by mouth once daily.  ? ? cyanocobalamin (VITAMIN B12) 1000 MCG tablet Take 1,000 mcg by mouth once daily  ? ketoconazole (NIZORAL) 2 % shampoo  ? metoprolol tartrate (LOPRESSOR) 25 MG tablet Take 1 tablet (25 mg total) by mouth 2 (two) times daily 180 tablet 3  ? triamcinolone 0.1 % cream  ? ?No current Epic-ordered facility-administered medications on file.  ? ?Allergies: ?Allergies  ?Allergen Reactions  ? Atorvastatin Muscle Pain  ? Celebrex [Celecoxib] Anxiety  ? Methotrexate Sodium Nausea  ?Had been having side effects (trouble with vision, nervous and digestive systems, nausea, lost around 15 pounds), so stopped taking it in October 2017.  ? Actonel [Risedronate] Diarrhea  ? Ibuprofen Itching  ? Tolmetin Nausea and Abdominal Pain  ? ? ?Body mass index is 26.81 kg/m?. ? ?Review of Systems: ?A comprehensive 14 point ROS was performed, reviewed, and the pertinent orthopaedic findings are documented in the HPI. ? ?Vitals:  ?05/05/21 0850  ?BP: 120/78  ? ? ?General Physical Examination:  ? ?General:  ?Well developed, well nourished, no apparent distress, normal affect, normal gait with no antalgic component.  ? ?HEENT: ?Head normocephalic, atraumatic, PERRL.  ? ?Abdomen: ?Soft, non tender, non distended, Bowel sounds present. ? ?Heart: ?Examination of the heart reveals regular, rate, and rhythm. There is no murmur noted on ascultation. There is a normal apical pulse. ? ?Lungs: ?Lungs are clear to auscultation. There is no wheeze, rhonchi, or crackles. There is normal expansion of bilateral chest walls.  ? ?Right knee: ?  On exam, right knee range of motion of 5-120 degrees. Right patella tracks well. Valgus deformity that is slightly passively correctable ? ?Radiographs: ?X-rays of the right knee reviewed by me from 07/07/2020 show complete loss of joint space and lateral compartment with severe valgus deformity with chondrocalcinosis and degenerative changes in the  medial and patellofemoral compartment. No evidence of acute bony abnormality or effusion. ? ?Assessment: ?ICD-10-CM  ?1. Primary osteoarthritis of right knee M17.11  ? ?Plan: ? ?81. 88 year old female with advanced right knee osteoarthritis. Pain is severe and interfering with quality of life and activities daily living. Risks, benefits, complications of a right total knee arthroplasty have been discussed with the patient. Patient has agreed and consent the procedure with Alicia Lambert on 05/13/2021. ?Electronically signed by Feliberto Gottron, PA at 05/05/2021 10:22 AM EST ? ?Reviewed  H+P. ?No changes noted. ? ? ?

## 2021-05-13 NOTE — Anesthesia Preprocedure Evaluation (Addendum)
Anesthesia Evaluation  ?Patient identified by MRN, date of birth, ID band ?Patient awake ? ? ? ?Reviewed: ?Allergy & Precautions, NPO status , Patient's Chart, lab work & pertinent test results, reviewed documented beta blocker date and time  ? ?Airway ?Mallampati: III ? ?TM Distance: >3 FB ?Neck ROM: full ? ? ? Dental ? ?(+) Missing,  ?  ?Pulmonary ?neg pulmonary ROS,  ?  ?Pulmonary exam normal ? ? ? ? ? ? ? Cardiovascular ?METS: 3 - Mets hypertension, Pt. on home beta blockers ?+ CAD ([place], LHC 2018), + Peripheral Vascular Disease, +CHF (G1DD, h/o pulm hypertension) and + DOE  ?Normal cardiovascular exam+ dysrhythmias (LBBB, Palpitations Tachycardia Reasonably Controlled) + Valvular Problems/Murmurs (Mod AR) MR  ? ? LHC 07/21/2016: EF 55-65%; LVEDP norm; 25% pLCx; no intervention. ? ?TTE 05/13/2019: EF 45%; mild LVH, mild LA enlargement; triv PR, mild MR/TR, mod AR; G1DD ?  ?Neuro/Psych ?Chronically dilated and nonreactive R eye from an injury with "normal vision" per patient ?negative psych ROS  ? GI/Hepatic ?negative GI ROS, Neg liver ROS,   ?Endo/Other  ?negative endocrine ROS ? Renal/GU ?  ? ?  ?Musculoskeletal ? ?(+) Arthritis , Osteoarthritis and Rheumatoid disorders,   ? Abdominal ?Normal abdominal exam  (+)   ?Peds ? Hematology ?negative hematology ROS ?(+)   ?Anesthesia Other Findings ?Past Medical History: ?No date: Anginal pain ([place]) ?No date: Aortic atherosclerosis ([place]) ?No date: B12 deficiency ?No date: Basal cell carcinoma (BCC) of skin of nose ?No date: Bilateral cataracts ?    Comment:  a.) s/p extraction ?No date: Bilateral occipital neuralgia ?No date: Chondrocalcinosis ?07/21/2016: Coronary artery disease ?    Comment:  a.) LHC 07/21/2016: EF 55-65%; LVEDP norm; 25% pLCx; no  ?             intervention. ?73/22/0254: Diastolic dysfunction ?    Comment:  a.) TTE 03/17/2017: EF 45%; mild LVH, mod LA  ?             enlargement, global HK, G1DD. b.) TTE 12/12/2017:  EF 45%; ?             global HK, mild LVH adn LA enlargement; triv PR, mild  ?             AR/MR, mod TR; G1DD. c.) TTE 05/13/2019: EF 45%; mild  ?             LVH, mild LA enlargement; triv PR, mild MR/TR, mod AR;  ?             G1DD. ?No date: GERD (gastroesophageal reflux disease) ?No date: Headache disorder ?No date: HLD (hyperlipidemia) ?No date: Hypertension ?No date: LBBB (left bundle branch block) ?No date: Memory difficulty ?No date: Osteoarthritis ?No date: Palpitations ?03/17/2017: Pulmonary HTN ([place]) ?    Comment:  a.) TTE 03/17/2017: EF 45%; RVSP 36.9 mmHg. b.) TTE  ?             12/15/2017: EF 45%; RVSP 40.3 mmHg. c.) TTE 05/13/2019;  ?             EF 45%; EF 45%; RVSP 42.0 mmHg ?No date: PVD (peripheral vascular disease) ([place]) ?No date: Rheumatoid arthritis ([place]) ? ?Past Surgical History: ?1993: BACK SURGERY ?    Comment:  Lower ?No date: CATARACT EXTRACTION; Right ?01/26/2015: CATARACT EXTRACTION W/PHACO; Left ?    Comment:  Procedure: CATARACT EXTRACTION PHACO AND INTRAOCULAR  ?             LENS  PLACEMENT (IOC);  Surgeon: Estill Cotta, MD;   ?             Location: [facility] ORS;  Service: Ophthalmology;  Laterality: ?             Left;  [place] 01:49 ?AP% 24.2 ?CDE 45.30 ?fluid pack lot #  ?             [id number] H ?No date: CTR ?07/21/2016: LEFT HEART CATH AND CORONARY ANGIOGRAPHY; N/A ?    Comment:  Procedure: Left Heart Cath and Coronary Angiography;   ?             Surgeon: Yolonda Kida, MD;  Location: [facility] ?             CV LAB;  Service: Cardiovascular;  Laterality: N/A; ? ? ? ? Reproductive/Obstetrics ?negative OB ROS ? ?  ? ? ? ? ? ? ? ? ? ? ? ? ? ?  ?  ? ? ? ? ? ?Anesthesia Physical ?Anesthesia Plan ? ?ASA: 3 ? ?Anesthesia Plan: General/Spinal  ? ?Post-op Pain Management:   ? ?Induction: Intravenous ? ?PONV Risk Score and Plan: 2 and Propofol infusion and TIVA ? ?Airway Management Planned: Natural Airway and Simple Face Mask ? ?Additional Equipment:  ? ?Intra-op Plan:   ? ?Post-operative Plan:  ? ?Informed Consent: I have reviewed the patients History and Physical, chart, labs and discussed the procedure including the risks, benefits and alternatives for the proposed anesthesia with the patient or authorized representative who has indicated his/her understanding and acceptance.  ? ? ? ?Dental advisory given ? ?Plan Discussed with: Anesthesiologist, CRNA and Surgeon ? ?Anesthesia Plan Comments:   ? ? ? ? ?Anesthesia Quick Evaluation ? ?

## 2021-05-13 NOTE — TOC Progression Note (Signed)
Transition of Care (TOC) - Progression Note  ? ? ?Patient Details  ?Name: Alicia Lambert ?MRN: [id number] ?Date of Birth: June 21, 1927 ? ?Transition of Care (TOC) CM/SW Contact  ?Conception Oms, RN ?Phone Number: ?05/13/2021, 2:10 PM ? ?Clinical Narrative:   [place] set up prior to Surgery by Surgeons office for [facility] ? ? ? ?  ?  ? ?Expected Discharge Plan and Services ?  ?  ?  ?  ?  ?                ?  ?  ?  ?  ?  ?  ?  ?  ?  ?  ? ? ?Social Determinants of Health (SDOH) Interventions ?  ? ?Readmission Risk Interventions ?No flowsheet data found. ? ?

## 2021-05-14 ENCOUNTER — Encounter: Payer: Self-pay | Admitting: Orthopedic Surgery

## 2021-05-14 DIAGNOSIS — M1711 Unilateral primary osteoarthritis, right knee: Secondary | ICD-10-CM | POA: Diagnosis present

## 2021-05-14 DIAGNOSIS — Z79899 Other long term (current) drug therapy: Secondary | ICD-10-CM | POA: Diagnosis not present

## 2021-05-14 DIAGNOSIS — Z7982 Long term (current) use of aspirin: Secondary | ICD-10-CM | POA: Diagnosis not present

## 2021-05-14 DIAGNOSIS — I2510 Atherosclerotic heart disease of native coronary artery without angina pectoris: Secondary | ICD-10-CM | POA: Diagnosis present

## 2021-05-14 DIAGNOSIS — Z66 Do not resuscitate: Secondary | ICD-10-CM | POA: Diagnosis present

## 2021-05-14 DIAGNOSIS — Z85828 Personal history of other malignant neoplasm of skin: Secondary | ICD-10-CM | POA: Diagnosis not present

## 2021-05-14 DIAGNOSIS — Z20822 Contact with and (suspected) exposure to covid-19: Secondary | ICD-10-CM | POA: Diagnosis present

## 2021-05-14 DIAGNOSIS — Z886 Allergy status to analgesic agent status: Secondary | ICD-10-CM | POA: Diagnosis not present

## 2021-05-14 DIAGNOSIS — Z888 Allergy status to other drugs, medicaments and biological substances status: Secondary | ICD-10-CM | POA: Diagnosis not present

## 2021-05-14 DIAGNOSIS — E785 Hyperlipidemia, unspecified: Secondary | ICD-10-CM | POA: Diagnosis present

## 2021-05-14 DIAGNOSIS — Z8249 Family history of ischemic heart disease and other diseases of the circulatory system: Secondary | ICD-10-CM | POA: Diagnosis not present

## 2021-05-14 DIAGNOSIS — M069 Rheumatoid arthritis, unspecified: Secondary | ICD-10-CM | POA: Diagnosis present

## 2021-05-14 DIAGNOSIS — I739 Peripheral vascular disease, unspecified: Secondary | ICD-10-CM | POA: Diagnosis present

## 2021-05-14 DIAGNOSIS — Z961 Presence of intraocular lens: Secondary | ICD-10-CM | POA: Diagnosis present

## 2021-05-14 DIAGNOSIS — E538 Deficiency of other specified B group vitamins: Secondary | ICD-10-CM | POA: Diagnosis present

## 2021-05-14 DIAGNOSIS — I700 Atherosclerosis of aorta: Secondary | ICD-10-CM | POA: Diagnosis present

## 2021-05-14 DIAGNOSIS — D62 Acute posthemorrhagic anemia: Secondary | ICD-10-CM | POA: Diagnosis not present

## 2021-05-14 DIAGNOSIS — I447 Left bundle-branch block, unspecified: Secondary | ICD-10-CM | POA: Diagnosis present

## 2021-05-14 DIAGNOSIS — M21061 Valgus deformity, not elsewhere classified, right knee: Secondary | ICD-10-CM | POA: Diagnosis present

## 2021-05-14 DIAGNOSIS — I272 Pulmonary hypertension, unspecified: Secondary | ICD-10-CM | POA: Diagnosis present

## 2021-05-14 DIAGNOSIS — I10 Essential (primary) hypertension: Secondary | ICD-10-CM | POA: Diagnosis present

## 2021-05-14 DIAGNOSIS — M11261 Other chondrocalcinosis, right knee: Secondary | ICD-10-CM | POA: Diagnosis present

## 2021-05-14 LAB — BASIC METABOLIC PANEL
Anion gap: 4 — ABNORMAL LOW (ref 5–15)
BUN: 18 mg/dL (ref 8–23)
CO2: 29 mmol/L (ref 22–32)
Calcium: 8.2 mg/dL — ABNORMAL LOW (ref 8.9–10.3)
Chloride: 104 mmol/L (ref 98–111)
Creatinine, Ser: 1.03 mg/dL — ABNORMAL HIGH (ref 0.44–1.00)
GFR, Estimated: 51 mL/min — ABNORMAL LOW (ref >60)
Glucose, Bld: 161 mg/dL — ABNORMAL HIGH (ref 70–99)
Potassium: 3.8 mmol/L (ref 3.5–5.1)
Sodium: 137 mmol/L (ref 135–145)

## 2021-05-14 LAB — CBC
HCT: 25.5 % — ABNORMAL LOW (ref 36.0–46.0)
Hemoglobin: 8.1 g/dL — ABNORMAL LOW (ref 12.0–15.0)
MCH: 31 pg (ref 26.0–34.0)
MCHC: 31.8 g/dL (ref 30.0–36.0)
MCV: 97.7 fL (ref 80.0–100.0)
Platelets: 147 10*3/uL — ABNORMAL LOW (ref 150–400)
RBC: 2.61 MIL/uL — ABNORMAL LOW (ref 3.87–5.11)
RDW: 12.7 % (ref 11.5–15.5)
WBC: 6.6 10*3/uL (ref 4.0–10.5)
nRBC: 0 % (ref 0.0–0.2)

## 2021-05-14 MED ORDER — SODIUM CHLORIDE 0.9 % IV BOLUS
500.0000 mL | Freq: Once | INTRAVENOUS | Status: AC
  Administered 2021-05-14: 500 mL via INTRAVENOUS

## 2021-05-14 MED ORDER — POLYETHYLENE GLYCOL 3350 17 G PO PACK: 17.0000 g | PACK | Freq: Every day | ORAL | 0 refills | Status: DC | PRN

## 2021-05-14 MED ORDER — HYDROCODONE-ACETAMINOPHEN 7.5-325 MG PO TABS: 1.0000 | ORAL_TABLET | ORAL | 0 refills | Status: AC | PRN

## 2021-05-14 MED ORDER — FE FUMARATE-B12-VIT C-FA-IFC PO CAPS
1.0000 | ORAL_CAPSULE | Freq: Two times a day (BID) | ORAL | Status: DC
  Administered 2021-05-15 – 2021-05-20 (×11): 1 via ORAL
  Filled 2021-05-14 (×15): qty 1

## 2021-05-14 MED ORDER — ENOXAPARIN SODIUM 40 MG/0.4ML IJ SOSY: 40.0000 mg | PREFILLED_SYRINGE | INTRAMUSCULAR | 0 refills | Status: AC

## 2021-05-14 MED ORDER — FE FUMARATE-B12-VIT C-FA-IFC PO CAPS: 1.0000 | ORAL_CAPSULE | Freq: Two times a day (BID) | ORAL | 0 refills | Status: AC

## 2021-05-14 MED ORDER — DOCUSATE SODIUM 100 MG PO CAPS: 100.0000 mg | ORAL_CAPSULE | Freq: Two times a day (BID) | ORAL | 0 refills | Status: AC

## 2021-05-14 MED ORDER — SODIUM CHLORIDE 0.9 % IV SOLN: INTRAVENOUS | Status: DC

## 2021-05-14 MED ORDER — TRAMADOL HCL 50 MG PO TABS
50.0000 mg | ORAL_TABLET | Freq: Four times a day (QID) | ORAL | Status: DC | PRN
  Administered 2021-05-14 – 2021-05-20 (×3): 50 mg via ORAL
  Filled 2021-05-14 (×3): qty 1

## 2021-05-14 NOTE — Anesthesia Postprocedure Evaluation (Signed)
Anesthesia Post Note ? ?Patient: Alicia Lambert ? ?Procedure(s) Performed: TOTAL KNEE ARTHROPLASTY (Right: Knee) ? ?Patient location during evaluation: Nursing Unit ?Anesthesia Type: Spinal ?Level of consciousness: oriented and awake and alert ?Pain management: pain level controlled ?Vital Signs Assessment: post-procedure vital signs reviewed and stable ?Respiratory status: spontaneous breathing and respiratory function stable ?Cardiovascular status: blood pressure returned to baseline and stable ?Postop Assessment: no headache, no backache, no apparent nausea or vomiting and patient able to bend at knees ?Anesthetic complications: no ? ? ?No notable events documented. ? ? ?Last Vitals:  ?Vitals:  ? 05/13/21 2225 05/14/21 0513  ?BP:  (!) 114/49  ?Pulse: (!) 56 (!) 52  ?Resp:  16  ?Temp:  37 ?C  ?SpO2:  98%  ?  ?Last Pain:  ?Vitals:  ? 05/13/21 2246  ?TempSrc:   ?PainSc: Asleep  ? ? ?  ?  ?  ?  ?  ?  ? ?Doreen Salvage A ? ? ? ? ?

## 2021-05-14 NOTE — TOC Progression Note (Signed)
Transition of Care (TOC) - Progression Note  ? ? ?Patient Details  ?Name: Alicia Lambert ?MRN: [id number] ?Date of Birth: [birth year] ? ?Transition of Care (TOC) CM/SW Contact  ?Conception Oms, RN ?Phone Number: ?05/14/2021, 2:18 PM ? ?Clinical Narrative:    ?No bed offers at this time, will continue to search ? ? ?Expected Discharge Plan: [place] ?Barriers to Discharge: Continued Medical Work up, SNF Pending bed offer, Insurance Authorization ? ?Expected Discharge Plan and Services ?Expected Discharge Plan: [place] ?  ?Discharge Planning Services: CM Consult ?  ?Living arrangements for the past 2 months: [place] ?                ?DME Arranged: N/A ?  ?  ?  ?  ?  ?[place] Agency: NA ?  ?  ?  ? ? ?Social Determinants of Health (SDOH) Interventions ?  ? ?Readmission Risk Interventions ?No flowsheet data found. ? ?

## 2021-05-14 NOTE — Discharge Instructions (Signed)
? ?TOTAL KNEE REPLACEMENT POSTOPERATIVE DIRECTIONS ? ?Knee Rehabilitation, Guidelines Following Surgery  ?Results after knee surgery are often greatly improved when you follow the exercise, range of motion and muscle strengthening exercises prescribed by your doctor. Safety measures are also important to protect the knee from further injury. Any time any of these exercises cause you to have increased pain or swelling in your knee joint, decrease the amount until you are comfortable again and slowly increase them. If you have problems or questions, call your caregiver or physical therapist for advice.  ? ?HOME CARE INSTRUCTIONS  ?Remove items at home which could result in a fall. This includes throw rugs or furniture in walking pathways.  ?Continue to use polar care unit on the knee for pain and swelling from surgery. You may notice swelling that will progress down to the foot and ankle.  This is normal after surgery.  Elevate the leg when you are not up walking on it.   ?Continue to use the breathing machine which will help keep your temperature down.  It is common for your temperature to cycle up and down following surgery, especially at night when you are not up moving around and exerting yourself.  The breathing machine keeps your lungs expanded and your temperature down. ?Do not place pillow under knee, focus on keeping the knee STRAIGHT while resting ? ?DIET ?You may resume your previous home diet once your are discharged from the hospital. ? ?DRESSING / WOUND CARE / SHOWERING ?Please remove provena negative pressure dressing on 05/24/2022 and apply honey comb dressing. Keep dressing clean and dry at all times.  ? ?ACTIVITY ?Walk with your walker as instructed. ?Use walker as long as suggested by your caregivers. ?Avoid periods of inactivity such as sitting longer than an hour when not asleep. This helps prevent blood clots.  ?You may resume a sexual relationship in one month or when given the OK by your  doctor.  ?You may return to work once you are cleared by your doctor.  ?Do not drive a car for 6 weeks or until released by you surgeon.  ?Do not drive while taking narcotics. ? ?WEIGHT BEARING ?Weight bearing as tolerated with assist device (walker, cane, etc) as directed, use it as long as suggested by your surgeon or therapist, typically at least 4-6 weeks. ? ?POSTOPERATIVE CONSTIPATION PROTOCOL ?Constipation - defined medically as fewer than three stools per week and severe constipation as less than one stool per week. ? ?One of the most common issues patients have following surgery is constipation.  Even if you have a regular bowel pattern at home, your normal regimen is likely to be disrupted due to multiple reasons following surgery.  Combination of anesthesia, postoperative narcotics, change in appetite and fluid intake all can affect your bowels.  In order to avoid complications following surgery, here are some recommendations in order to help you during your recovery period. ? ?Colace (docusate) - Pick up an over-the-counter form of Colace or another stool softener and take twice a day as long as you are requiring postoperative pain medications.  Take with a full glass of water daily.  If you experience loose stools or diarrhea, hold the colace until you stool forms back up.  If your symptoms do not get better within 1 week or if they get worse, check with your doctor. ? ?Dulcolax (bisacodyl) - Pick up over-the-counter and take as directed by the product packaging as needed to assist with the movement of your bowels.  Take with a full glass of water.  Use this product as needed if not relieved by Colace only.  ? ?MiraLax (polyethylene glycol) - Pick up over-the-counter to have on hand.  MiraLax is a solution that will increase the amount of water in your bowels to assist with bowel movements.  Take as directed and can mix with a glass of water, juice, soda, coffee, or tea.  Take if you go more than two  days without a movement. ?Do not use MiraLax more than once per day. Call your doctor if you are still constipated or irregular after using this medication for 7 days in a row. ? ?If you continue to have problems with postoperative constipation, please contact the office for further assistance and recommendations.  If you experience "the worst abdominal pain ever" or develop nausea or vomiting, please contact the office immediatly for further recommendations for treatment. ? ?ITCHING ? If you experience itching with your medications, try taking only a single pain pill, or even half a pain pill at a time.  You can also use Benadryl over the counter for itching or also to help with sleep.  ? ?TED HOSE STOCKINGS ?Wear the elastic stockings on both legs for six weeks following surgery during the day but you may remove then at night for sleeping. ? ?MEDICATIONS ?See your medication summary on the ?After Visit Summary? that the nursing staff will review with you prior to discharge.  You may have some home medications which will be placed on hold until you complete the course of blood thinner medication.  It is important for you to complete the blood thinner medication as prescribed by your surgeon.  Continue your approved medications as instructed at time of discharge. ? ?PRECAUTIONS ?If you experience chest pain or shortness of breath - call 911 immediately for transfer to the hospital emergency department.  ?If you develop a fever greater that 101 F, purulent drainage from wound, increased redness or drainage from wound, foul odor from the wound/dressing, or calf pain - CONTACT YOUR SURGEON.   ?                                                ?FOLLOW-UP APPOINTMENTS ?Make sure you keep all of your appointments after your operation with your surgeon and caregivers. You should call the office at the above phone number and make an appointment for approximately two weeks after the date of your surgery or on the date  instructed by your surgeon outlined in the "After Visit Summary". ? ? ?RANGE OF MOTION AND STRENGTHENING EXERCISES  ?Rehabilitation of the knee is important following a knee injury or an operation. After just a few days of immobilization, the muscles of the thigh which control the knee become weakened and shrink (atrophy). Knee exercises are designed to build up the tone and strength of the thigh muscles and to improve knee motion. Often times heat used for twenty to thirty minutes before working out will loosen up your tissues and help with improving the range of motion but do not use heat for the first two weeks following surgery. These exercises can be done on a training (exercise) mat, on the floor, on a table or on a bed. Use what ever works the best and is most comfortable for you Knee exercises include:  ?Leg Lifts - While your  knee is still immobilized in a splint or cast, you can do straight leg raises. Lift the leg to 60 degrees, hold for 3 sec, and slowly lower the leg. Repeat 10-20 times 2-3 times daily. Perform this exercise against resistance later as your knee gets better.  ?Quad and Hamstring Sets - Tighten up the muscle on the front of the thigh (Quad) and hold for 5-10 sec. Repeat this 10-20 times hourly. Hamstring sets are done by pushing the foot backward against an object and holding for 5-10 sec. Repeat as with quad sets.  ?Leg Slides: Lying on your back, slowly slide your foot toward your buttocks, bending your knee up off the floor (only go as far as is comfortable). Then slowly slide your foot back down until your leg is flat on the floor again. ?Angel Wings: Lying on your back spread your legs to the side as far apart as you can without causing discomfort.  ?A rehabilitation program following serious knee injuries can speed recovery and prevent re-injury in the future due to weakened muscles. Contact your doctor or a physical therapist for more information on knee rehabilitation.  ? ?IF YOU  ARE TRANSFERRED TO A SKILLED REHAB FACILITY ?If the patient is transferred to a skilled rehab facility following release from the hospital, a list of the current medications will be sent to the facility for the patie

## 2021-05-14 NOTE — Progress Notes (Signed)
Physical Therapy Treatment ?Patient Details ?Name: Alicia Lambert ?MRN: [id number] ?DOB: 04/04/1927 ?Today's Date: 05/14/2021 ? ? ?History of Present Illness Pt is a 88 y.o. female s/p R TKA 05/13/21 secondary primary localized OA.  PMH includes CAD, LBBB, PVD, aortic atherosclerosis, angina, diastolic dysfunction, pulmonary hypertension, palpitations, HTN, HLD, GERD, OA, RA, memory difficulties, and back surgery. ? ?  ?PT Comments  ? ? Pt resting in bed upon PT arrival; agreeable to PT session.  OT present later in session for PT/OT co-session.  Minimal pain at rest but increased R knee pain noted with activity.  Min assist semi-supine to sitting edge of bed; mod assist x1 to stand from bed first trial; mod assist x1 plus CGA of 2nd to stand from bed 2nd trial; and min assist x2 to move laterally towards R along bed in standing using RW (towards head of bed)--pt taking steps with R LE but unable to take steps with L LE (d/t R knee pain limiting pt's ability to bear any weight through R LE to advance L LE) so pt pivoted on L LE instead; and 2 assist to lay back down in bed.  Pt became nauseas with activity limiting sessions activities.  Orthostatic vitals taken: supine BP 102/48; sitting BP 111/71; standing BP at 0 minutes 97/58; and standing BP at 3 minutes 83/47 (pt dizzy and nauseas while standing).  Nurse brought pt nausea meds and present with pt end of session.  MD Rudene Christians updated on pt's therapy session.  ?  ?Recommendations for follow up therapy are one component of a multi-disciplinary discharge planning process, led by the attending physician.  Recommendations may be updated based on patient status, additional functional criteria and insurance authorization. ? ?Follow Up Recommendations ? Skilled nursing-short term rehab (<3 hours/day) ?  ?  ?Assistance Recommended at Discharge Frequent or constant Supervision/Assistance  ?Patient can return home with the following A lot of help with walking and/or transfers;A  lot of help with bathing/dressing/bathroom;Assistance with cooking/housework;Assist for transportation;Help with stairs or ramp for entrance;Direct supervision/assist for medications management ?  ?Equipment Recommendations ? Rolling walker (2 wheels);BSC/3in1  ?  ?Recommendations for Other Services OT consult ? ? ?  ?Precautions / Restrictions Precautions ?Precautions: Fall ?Precaution Comments: wound vac ?Restrictions ?Weight Bearing Restrictions: Yes ?RLE Weight Bearing: Weight bearing as tolerated  ?  ? ?Mobility ? Bed Mobility ?Overal bed mobility: Needs Assistance ?Bed Mobility: Supine to Sit, Sit to Supine ?  ?  ?Supine to sit: Min assist ?Sit to supine: +2 for safety/equipment ?  ?General bed mobility comments: assist for R LE semi-supine to sitting edge of bed; 2 assist to lay back down in bed for pt safety ?  ? ?Transfers ?Overall transfer level: Needs assistance ?Equipment used: Rolling walker (2 wheels) ?Transfers: Sit to/from Stand ?Sit to Stand: Mod assist, Min guard ?  ?  ?  ?  ?  ?General transfer comment: mod assist x1 to stand from bed 1st trial; mod assist x1 plus CGA of 2nd for safety standing from bed 2nd trial; use of RW; vc's for UE/LE placement ?  ? ?Ambulation/Gait ?Ambulation/Gait assistance: Min assist, +2 physical assistance ?Gait Distance (Feet):  (moved towards R about 3 feet along bed (towards head of bed)) ?Assistive device: Rolling walker (2 wheels) ?  ?Gait velocity: decreased ?  ?  ?General Gait Details: pt able to sidestep towards R with R LE but pivoted on L LE (even with max cueing for technique in order to take step with  L LE and offload R LE via increasing UE support through RW) ? ? ?Stairs ?  ?  ?  ?  ?  ? ? ?Wheelchair Mobility ?  ? ?Modified Rankin (Stroke Patients Only) ?  ? ? ?  ?Balance Overall balance assessment: Needs assistance ?Sitting-balance support: No upper extremity supported, Feet supported ?Sitting balance-Leahy Scale: Good ?Sitting balance - Comments: steady  sitting reaching within BOS ?  ?Standing balance support: Bilateral upper extremity supported ?Standing balance-Leahy Scale: Poor ?Standing balance comment: assist for safety standing with B UE support on RW ?  ?  ?  ?  ?  ?  ?  ?  ?  ?  ?  ?  ? ?  ?Cognition Arousal/Alertness: Awake/alert ?Behavior During Therapy: Impulsive ?Overall Cognitive Status: Impaired/Different from baseline ?  ?  ?  ?  ?  ?  ?  ?  ?  ?  ?  ?  ?  ?  ?  ?  ?General Comments: Appearing forgetful and general confusion noted (pt was oriented to self, place, month, year, and general situation) ?  ?  ? ?  ?Exercises  ?  ?General Comments  Nursing cleared pt for participation in physical therapy.  Pt agreeable to PT session. ?  ?  ? ?Pertinent Vitals/Pain Pain Assessment ?Pain Assessment: Faces ?Faces Pain Scale: Hurts even more ?Pain Location: R knee ?Pain Descriptors / Indicators: Aching, Sore, Tender ?Pain Intervention(s): Limited activity within patient's tolerance, Monitored during session, Premedicated before session, Repositioned, Other (comment) (polar care applied)  ? ? ?Home Living   ?  ?  ?  ?  ?  ?  ?  ?  ?  ?   ?  ?Prior Function    ?  ?  ?   ? ?PT Goals (current goals can now be found in the care plan section) Acute Rehab PT Goals ?Patient Stated Goal: to improve mobility ?PT Goal Formulation: With patient/family ?Time For Goal Achievement: 05/27/21 ?Potential to Achieve Goals: Good ?Progress towards PT goals: Progressing toward goals ? ?  ?Frequency ? ? ? BID ? ? ? ?  ?PT Plan Current plan remains appropriate  ? ? ?Co-evaluation   ?  ?  ?  ?  ? ?  ?AM-PAC PT "6 Clicks" Mobility   ?Outcome Measure ? Help needed turning from your back to your side while in a flat bed without using bedrails?: None ?Help needed moving from lying on your back to sitting on the side of a flat bed without using bedrails?: A Little ?Help needed moving to and from a bed to a chair (including a wheelchair)?: Total ?Help needed standing up from a chair using  your arms (e.g., wheelchair or bedside chair)?: A Lot ?Help needed to walk in hospital room?: Total ?Help needed climbing 3-5 steps with a railing? : Total ?6 Click Score: 12 ? ?  ?End of Session Equipment Utilized During Treatment: Gait belt ?Activity Tolerance: Patient limited by pain;Treatment limited secondary to medical complications (Comment);Other (comment) (limited d/t nausea and low BP) ?Patient left: in bed;with call bell/phone within reach;with bed alarm set;with SCD's reapplied;Other (comment);with nursing/sitter in room (R heel in bone foam; L heel floating via towel roll; polar care in place) ?Nurse Communication: Mobility status;Precautions;Weight bearing status;Other (comment) (pt's symptoms and BP) ?PT Visit Diagnosis: Unsteadiness on feet (R26.81);Other abnormalities of gait and mobility (R26.89);Muscle weakness (generalized) (M62.81);Pain ?Pain - Right/Left: Right ?Pain - part of body: Knee ?  ? ? ?Time: 3976-7341 ?PT  Time Calculation (min) (ACUTE ONLY): 39 min ? ?Charges:  $Gait Training: 8-22 mins ?$Therapeutic Exercise: 8-22 mins ?$Therapeutic Activity: 8-22 mins          ?          ?Leitha Bleak, PT ?05/14/21, 3:21 PM ? ? ?

## 2021-05-14 NOTE — Progress Notes (Signed)
Physical Therapy Treatment ?Patient Details ?Name: Alicia Lambert ?MRN: [id number] ?DOB: 1927/06/23 ?Today's Date: 05/14/2021 ? ? ?History of Present Illness Pt is a 88 y.o. female s/p R TKA 05/13/21 secondary primary localized OA.  PMH includes CAD, LBBB, PVD, aortic atherosclerosis, angina, diastolic dysfunction, pulmonary hypertension, palpitations, HTN, HLD, GERD, OA, RA, memory difficulties, and back surgery. ? ?  ?PT Comments  ? ? Pt sleeping in bed upon PT arrival; agreeable to PT session with a little encouragement.  Pt appearing with forgetfulness (noted yesterday as well) but also appearing more confused today with more difficulty following directions (nurse notified).  Tolerated LE ex's in bed fairly well with assist for R LE.  Min assist semi-supine to sitting edge of bed; pt requiring multiple trials to stand up to walker but eventually able to stand with mod assist; pt unable to take any steps with 1 person assist and walker use (pt with difficulty following cues and also c/o R knee pain); therapist had pt sit back down and 2nd assist called to assist with transfer; while pt sitting edge of bed pt suddenly c/o nausea so pt given emesis bag; pt then noted with involuntary jerking movements of UE's intermittently so therapist called pt's nurse who came to assess pt; while nursing was taking pt's vitals sitting edge of bed pt suddenly reporting feeling like she was going to pass out so therapists (PT and OT) assisted pt with laying down in bed (BP 95/51 with HR 56 bpm resting in bed).  Cold wash cloths placed on pt's forehead and posterior neck and pt reporting improved symptoms laying in bed.  Therapist called and updated pt's son [name] on pt's therapy session per pt's request.  Will continue to focus on strengthening, knee ROM, and progressive functional mobility during hospitalization. ?  ?Recommendations for follow up therapy are one component of a multi-disciplinary discharge planning process, led by  the attending physician.  Recommendations may be updated based on patient status, additional functional criteria and insurance authorization. ? ?Follow Up Recommendations ? Skilled nursing-short term rehab (<3 hours/day) ?  ?  ?Assistance Recommended at Discharge Frequent or constant Supervision/Assistance  ?Patient can return home with the following A lot of help with walking and/or transfers;A lot of help with bathing/dressing/bathroom;Assistance with cooking/housework;Assist for transportation;Help with stairs or ramp for entrance;Direct supervision/assist for medications management ?  ?Equipment Recommendations ? Rolling walker (2 wheels);BSC/3in1  ?  ?Recommendations for Other Services OT consult ? ? ?  ?Precautions / Restrictions Precautions ?Precautions: Fall ?Precaution Comments: wound vac ?Restrictions ?Weight Bearing Restrictions: Yes ?RLE Weight Bearing: Weight bearing as tolerated  ?  ? ?Mobility ? Bed Mobility ?Overal bed mobility: Needs Assistance ?Bed Mobility: Supine to Sit, Sit to Supine ?  ?  ?Supine to sit: Min assist ?Sit to supine: +2 for safety/equipment ?  ?General bed mobility comments: assist for R LE semi-supine to sitting edge of bed; 2 assist to lay back down in bed d/t safety concerns (pt reporting feeling like she was going to pass out) ?  ? ?Transfers ?Overall transfer level: Needs assistance ?Equipment used: Rolling walker (2 wheels) ?Transfers: Sit to/from Stand ?Sit to Stand: Mod assist ?  ?  ?  ?  ?  ?General transfer comment: pt requiring multiple trials (x5) to attempt to stand up to RW (pt had difficulty following vc's and tactile cues and visual demo to stand) but eventually able to stand up to RW with mod assist ?  ? ?Ambulation/Gait ?  ?  ?  ?  ?  ?  ?  ?  General Gait Details: pt unable to take any steps with max cueing and assist (pt with difficulty with sequencing and following cues) ? ? ?Stairs ?  ?  ?  ?  ?  ? ? ?Wheelchair Mobility ?  ? ?Modified Rankin (Stroke Patients  Only) ?  ? ? ?  ?Balance Overall balance assessment: Needs assistance ?Sitting-balance support: No upper extremity supported, Feet supported ?Sitting balance-Leahy Scale: Good ?Sitting balance - Comments: steady sitting reaching within BOS ?  ?Standing balance support: Bilateral upper extremity supported ?Standing balance-Leahy Scale: Poor ?Standing balance comment: assist for safety standing with B UE support on RW ?  ?  ?  ?  ?  ?  ?  ?  ?  ?  ?  ?  ? ?  ?Cognition Arousal/Alertness:  (pt sleeping upon PT arrival but woken with vc's and gentle tactile cues) ?Behavior During Therapy: Impulsive ?Overall Cognitive Status: Impaired/Different from baseline ?  ?  ?  ?  ?  ?  ?  ?  ?  ?  ?  ?  ?  ?  ?  ?  ?General Comments: Appearing forgetful and general confusion noted (pt was oriented to self, place, month, year, and general situation) ?  ?  ? ?  ?Exercises Total Joint Exercises ?Ankle Circles/Pumps: AROM, Strengthening, Both, 10 reps, Supine ?Quad Sets: AROM, Strengthening, Both, 10 reps, Supine ?Short Arc Quad: AAROM, Strengthening, Right, 10 reps, Supine ?Heel Slides: AAROM, Strengthening, Right, 10 reps, Supine ?Hip ABduction/ADduction: AAROM, Strengthening, Right, 10 reps, Supine ?Straight Leg Raises: AAROM, Strengthening, Right, 10 reps, Supine ?Goniometric ROM: R knee AROM 5-60 degrees (limited d/t R knee pain) ? ?  ?General Comments  Nursing cleared pt for participation in physical therapy.  Pt's son present beginning of session. ?  ?  ? ?Pertinent Vitals/Pain Pain Assessment ?Pain Assessment: Faces ?Faces Pain Scale: Hurts even more ?Pain Location: R knee ?Pain Descriptors / Indicators: Aching, Sore, Tender ?Pain Intervention(s): Limited activity within patient's tolerance, Monitored during session, Premedicated before session, Repositioned, Other (comment) (polar care applied) ?O2 sats WFL on 2 L O2 via nasal cannula during sessions activities.  ? ? ?Home Living   ?  ?  ?  ?  ?  ?  ?  ?  ?  ?   ?  ?Prior  Function    ?  ?  ?   ? ?PT Goals (current goals can now be found in the care plan section) Acute Rehab PT Goals ?Patient Stated Goal: to improve mobility ?PT Goal Formulation: With patient/family ?Time For Goal Achievement: 05/27/21 ?Potential to Achieve Goals: Good ?Progress towards PT goals: Progressing toward goals ? ?  ?Frequency ? ? ? BID ? ? ? ?  ?PT Plan Current plan remains appropriate  ? ? ?Co-evaluation   ?  ?  ?  ?  ? ?  ?AM-PAC PT "6 Clicks" Mobility   ?Outcome Measure ? Help needed turning from your back to your side while in a flat bed without using bedrails?: None ?Help needed moving from lying on your back to sitting on the side of a flat bed without using bedrails?: A Little ?Help needed moving to and from a bed to a chair (including a wheelchair)?: Total ?Help needed standing up from a chair using your arms (e.g., wheelchair or bedside chair)?: A Lot ?Help needed to walk in hospital room?: Total ?Help needed climbing 3-5 steps with a railing? : Total ?6 Click Score: 12 ? ?  ?End of  Session Equipment Utilized During Treatment: Gait belt ?Activity Tolerance: Patient limited by pain ?Patient left: in bed;with call bell/phone within reach;with bed alarm set;with SCD's reapplied;Other (comment) (R heel in bone foam; polar care in place) ?Nurse Communication: Mobility status;Patient requests pain meds;Precautions;Weight bearing status;Other (comment) (nurse came to assess and check pt's vitals during session) ?PT Visit Diagnosis: Unsteadiness on feet (R26.81);Other abnormalities of gait and mobility (R26.89);Muscle weakness (generalized) (M62.81);Pain ?Pain - Right/Left: Right ?Pain - part of body: Knee ?  ? ? ?Time: 2548-6282 ?PT Time Calculation (min) (ACUTE ONLY): 45 min ? ?Charges:  $Therapeutic Exercise: 8-22 mins ?$Therapeutic Activity: 23-37 mins          ?          ?Leitha Bleak, PT ?05/14/21, 12:11 PM ? ? ?

## 2021-05-14 NOTE — [place] FL2 (Signed)
?[place] MEDICAID FL2 LEVEL OF CARE SCREENING TOOL  ?  ? ?IDENTIFICATION  ?Patient Name: ?Alicia Lambert Birthdate: 01-16-1928 Sex: female Admission Date (Current Location): ?05/13/2021  ?[place] and [place] Number: ? [place] ?  Facility and Address:  ?[facility], [address], [place], [place] [address] ?     Provider Number: ?[id number]  ?Attending Physician Name and Address:  ?Hessie Knows, MD ? Relative Name and Phone Number:  ?Lauro Regulus, [phone] ?   ?Current Level of Care: ?Hospital Recommended Level of Care: ?[place] Prior Approval Number: ?  ? ?Date Approved/Denied: ?  PASRR Number: ?  ? ?Discharge Plan: ?SNF ?  ? ?Current Diagnoses: ?Patient Active Problem List  ? Diagnosis Date Noted  ? S/P TKR (total knee replacement) using cement, right 05/13/2021  ? Ganglion cyst of finger of right hand 07/08/2019  ? CAD (coronary artery disease) 07/08/2019  ? Elevated troponin 07/22/2016  ? ? ?Orientation RESPIRATION BLADDER Height & Weight   ?  ?Self, Time, Situation, Place ? Normal, O2 (2 liter) Continent, External catheter Weight: 68 kg ?Height:  '5\' 4"'$  (162.6 cm)  ?BEHAVIORAL SYMPTOMS/MOOD NEUROLOGICAL BOWEL NUTRITION STATUS  ?    Continent Diet (see DC summary)  ?AMBULATORY STATUS COMMUNICATION OF NEEDS Skin   ?Extensive Assist Verbally Normal, Surgical wounds ?  ?  ?  ?    ?     ?     ? ? ?Personal Care Assistance Level of Assistance  ?Bathing, Feeding, Dressing Bathing Assistance: Limited assistance ?Feeding assistance: Limited assistance ?Dressing Assistance: Limited assistance ?   ? ?Functional Limitations Info  ?    ?  ?   ? ? ?SPECIAL CARE FACTORS FREQUENCY  ?PT (By licensed PT), OT (By licensed OT)   ?  ?PT Frequency: 5 times per week ?OT Frequency: 5 times per week ?  ?  ?  ?   ? ? ?Contractures Contractures Info: Not present  ? ? ?Additional Factors Info  ?Code Status, Allergies Code Status Info: DNR ?Allergies Info: Atorvastatin, Methotrexate Sodium,  Nsaids, Risedronate, Tolmetin, Celecoxib, Ibuprofen ?  ?  ?  ?   ? ?Current Medications (05/14/2021):  This is the current hospital active medication list ?Current Facility-Administered Medications  ?Medication Dose Route Frequency Provider Last Rate Last Admin  ? 0.9 %  sodium chloride infusion   Intravenous Continuous Hessie Knows, MD 75 mL/hr at 05/13/21 1531 New Bag at 05/13/21 1531  ? acetaminophen (TYLENOL) tablet 325-650 mg  325-650 mg Oral Q6H PRN Hessie Knows, MD      ? alum & mag hydroxide-simeth (MAALOX/MYLANTA) 200-200-20 MG/5ML suspension 30 mL  30 mL Oral Q4H PRN Hessie Knows, MD      ? aspirin EC tablet 81 mg  81 mg Oral Daily Hessie Knows, MD   81 mg at 05/13/21 1605  ? bisacodyl (DULCOLAX) EC tablet 5 mg  5 mg Oral Daily PRN Hessie Knows, MD   5 mg at 05/14/21 0759  ? diphenhydrAMINE (BENADRYL) 12.5 MG/5ML elixir 12.5-25 mg  12.5-25 mg Oral Q4H PRN Hessie Knows, MD      ? docusate sodium (COLACE) capsule 100 mg  100 mg Oral BID Hessie Knows, MD   100 mg at 05/13/21 2225  ? enoxaparin (LOVENOX) injection 30 mg  30 mg Subcutaneous Q12H Hessie Knows, MD   30 mg at 05/14/21 0160  ? ferrous [id number] C-folic acid (TRINSICON / FOLTRIN) capsule 1 capsule  1 capsule Oral BID PC Hessie Knows, MD      ?  gabapentin (NEURONTIN) capsule 100 mg  100 mg Oral Daily Benita Gutter, RPH      ? And  ? gabapentin (NEURONTIN) capsule 200 mg  200 mg Oral QHS Benita Gutter, RPH   200 mg at 05/13/21 2226  ? HYDROcodone-acetaminophen (NORCO) 7.5-325 MG per tablet 1-2 tablet  1-2 tablet Oral Q4H PRN Hessie Knows, MD   2 tablet at 05/14/21 0759  ? HYDROcodone-acetaminophen (NORCO/VICODIN) 5-325 MG per tablet 1-2 tablet  1-2 tablet Oral Q4H PRN Hessie Knows, MD   2 tablet at 05/13/21 2027  ? loratadine (CLARITIN) tablet 10 mg  10 mg Oral Daily PRN Hessie Knows, MD      ? magnesium hydroxide (MILK OF MAGNESIA) suspension 30 mL  30 mL Oral QHS Hessie Knows, MD   30 mL at 05/13/21 2227  ?  menthol-cetylpyridinium (CEPACOL) lozenge 3 mg  1 lozenge Oral PRN Hessie Knows, MD      ? Or  ? phenol (CHLORASEPTIC) mouth spray 1 spray  1 spray Mouth/Throat PRN Hessie Knows, MD      ? methocarbamol (ROBAXIN) tablet 500 mg  500 mg Oral Q6H PRN Hessie Knows, MD   500 mg at 05/14/21 0759  ? Or  ? methocarbamol (ROBAXIN) 500 mg in dextrose 5 % 50 mL IVPB  500 mg Intravenous Q6H PRN Hessie Knows, MD      ? metoCLOPramide (REGLAN) tablet 5-10 mg  5-10 mg Oral Q8H PRN Hessie Knows, MD      ? Or  ? metoCLOPramide (REGLAN) injection 5-10 mg  5-10 mg Intravenous Q8H PRN Hessie Knows, MD      ? metoprolol tartrate (LOPRESSOR) tablet 25 mg  25 mg Oral BID Hessie Knows, MD   25 mg at 05/13/21 2225  ? morphine (PF) 2 MG/ML injection 0.5-1 mg  0.5-1 mg Intravenous Q2H PRN Hessie Knows, MD   1 mg at 05/13/21 2216  ? multivitamin with minerals tablet 1 tablet  1 tablet Oral Daily Hessie Knows, MD      ? ondansetron [facility]) tablet 4 mg  4 mg Oral Q6H PRN Hessie Knows, MD      ? Or  ? ondansetron [facility] injection 4 mg  4 mg Intravenous Q6H PRN Hessie Knows, MD   4 mg at 05/14/21 8828  ? pantoprazole (PROTONIX) EC tablet 40 mg  40 mg Oral Daily Hessie Knows, MD   40 mg at 05/13/21 1605  ? polyethylene glycol (MIRALAX / GLYCOLAX) packet 17 g  17 g Oral Daily PRN Hessie Knows, MD      ? sodium phosphate (FLEET) 7-19 GM/118ML enema 1 enema  1 enema Rectal Once PRN Hessie Knows, MD      ? vitamin B-12 (CYANOCOBALAMIN) tablet 1,000 mcg  1,000 mcg Oral Daily Hessie Knows, MD      ? zolpidem (AMBIEN) tablet 5 mg  5 mg Oral QHS PRN Hessie Knows, MD      ? ? ? ?Discharge Medications: ?Please see discharge summary for a list of discharge medications. ? ?Relevant Imaging Results: ? ?Relevant Lab Results: ? ? ?Additional Information ?SS# [id number] ? ?Conception Oms, RN ? ? ? ? ?

## 2021-05-14 NOTE — Progress Notes (Signed)
Met with the patient and her son in the room, they are agreeable to go to STR, they prefer [facility], I explained [place] in not INN with her Ins ?He stated that Her sister died at [facility] so that is not an option, they agreed to a bedsearch to see who else could offer a bed, [facility] is down and I could not obtain PASSR number, FL2 completed otherwise, Bedsearch sent ?

## 2021-05-14 NOTE — Evaluation (Signed)
Occupational Therapy Evaluation ?Patient Details ?Name: Alicia Lambert ?MRN: [id number] ?DOB: February 25, 1928 ?Today's Date: 05/14/2021 ? ? ?History of Present Illness Pt is a 88 y.o. female s/p R TKA 05/13/21 secondary primary localized OA.  PMH includes CAD, LBBB, PVD, aortic atherosclerosis, angina, diastolic dysfunction, pulmonary hypertension, palpitations, HTN, HLD, GERD, OA, RA, memory difficulties, and back surgery.  ? ?Clinical Impression ?  ?Pt seen for OT evaluation this date, POD#1 from above surgery. Pt received seated EOB with PT. Pt noted with nausea limited initial attempt at OT evaluation. Pt somewhat improved this afternoon, however once pt returned to sitting after ADL transfers and lateral shuffled steps EOB to improve positioning, pt endorsed significant sudden nausea and gagging with emesis bag. Pt returned to bed with +2 assist. Pt currently requires MAX A for LB dressing while in seated position due to pain and limited AROM of R knee. Pt would benefit from skilled OT services including additional instruction in polar care mgt, compression stocking mgt, dressing techniques with or without assistive devices for dressing and bathing skills, and AE/DME for ADL, to support recall and carryover prior to discharge and ultimately to maximize safety, independence, and minimize falls risk and caregiver burden. Recommend STR at this time based on pt's limited ability to participate in ADL/mobility 2/2 noted impairments.   ? ?Recommendations for follow up therapy are one component of a multi-disciplinary discharge planning process, led by the attending physician.  Recommendations may be updated based on patient status, additional functional criteria and insurance authorization.  ? ?Follow Up Recommendations ? Skilled nursing-short term rehab (<3 hours/day)  ?  ?Assistance Recommended at Discharge Frequent or constant Supervision/Assistance  ?Patient can return home with the following A lot of help with walking  and/or transfers;A lot of help with bathing/dressing/bathroom;Assistance with cooking/housework;Assist for transportation;Help with stairs or ramp for entrance;Direct supervision/assist for financial management;Direct supervision/assist for medications management ? ?  ?Functional Status Assessment ? Patient has had a recent decline in their functional status and demonstrates the ability to make significant improvements in function in a reasonable and predictable amount of time.  ?Equipment Recommendations ? BSC/3in1  ?  ?Recommendations for Other Services   ? ? ?  ?Precautions / Restrictions Precautions ?Precautions: Fall ?Precaution Comments: wound vac ?Restrictions ?Weight Bearing Restrictions: Yes ?RLE Weight Bearing: Weight bearing as tolerated  ? ?  ? ?Mobility Bed Mobility ?Overal bed mobility: Needs Assistance ?Bed Mobility: Sit to Supine ?  ?  ?  ?Sit to supine: Mod assist, +2 for safety/equipment ?  ?  ?  ? ?Transfers ?Overall transfer level: Needs assistance ?Equipment used: Rolling walker (2 wheels) ?Transfers: Sit to/from Stand ?Sit to Stand: Mod assist, Min guard ?  ?  ?  ?  ?  ?General transfer comment: mod assist x1 to stand from bed 1st trial; mod assist x1 plus CGA of 2nd for safety standing from bed 2nd trial; use of RW; vc's for UE/LE placement ?  ? ?  ?Balance Overall balance assessment: Needs assistance ?Sitting-balance support: No upper extremity supported, Feet supported ?Sitting balance-Leahy Scale: Good ?  ?Postural control: Posterior lean ?Standing balance support: Bilateral upper extremity supported ?Standing balance-Leahy Scale: Poor ?Standing balance comment: assist for safety standing with B UE support on RW, slight posterior lean ?  ?  ?  ?  ?  ?  ?  ?  ?  ?  ?  ?   ? ?ADL either performed or assessed with clinical judgement  ? ?ADL Overall ADL's :  Needs assistance/impaired ?  ?  ?  ?  ?  ?  ?  ?  ?  ?  ?  ?  ?  ?  ?  ?  ?  ?  ?  ?General ADL Comments: Pt currently requires MAX A for  LB ADL tasks, set up and supv for seated UB ADL tasks, and MOD A for ADL transfers wiht +2 for safety, as she is very nauseated upon sitting  ? ? ? ?Vision   ?   ?   ?Perception   ?  ?Praxis   ?  ? ?Pertinent Vitals/Pain Pain Assessment ?Pain Assessment: Faces ?Faces Pain Scale: Hurts even more ?Pain Location: R knee ?Pain Descriptors / Indicators: Aching, Sore, Tender ?Pain Intervention(s): Limited activity within patient's tolerance, Monitored during session, Premedicated before session, Repositioned, Ice applied  ? ? ? ?Hand Dominance   ?  ?Extremity/Trunk Assessment Upper Extremity Assessment ?Upper Extremity Assessment: Overall WFL for tasks assessed ?  ?Lower Extremity Assessment ?Lower Extremity Assessment: RLE deficits/detail ?RLE Deficits / Details: s/p TKA, expected post-op strength and decreased ROM ?  ?Cervical / Trunk Assessment ?Cervical / Trunk Exceptions: forward head/shoulders ?  ?Communication Communication ?Communication: HOH ?  ?Cognition Arousal/Alertness: Awake/alert ?Behavior During Therapy: Impulsive ?Overall Cognitive Status: Impaired/Different from baseline ?  ?  ?  ?  ?  ?  ?  ?  ?  ?  ?  ?  ?  ?  ?  ?  ?General Comments: Appearing forgetful and general confusion noted (pt was oriented to self, place, month, year, and general situation) ?  ?  ?General Comments    ? ?  ?Exercises   ?  ?Shoulder Instructions    ? ? ?Home Living Family/patient expects to be discharged to:: Private residence ?Living Arrangements: Alone ?  ?Type of Home: House ?Home Access: Stairs to enter ?Entrance Stairs-Number of Steps: 2 ?Entrance Stairs-Rails: Right ?Home Layout: One level ?  ?  ?Bathroom Shower/Tub: Tub/shower unit ?  ?Bathroom Toilet: Handicapped height ?  ?  ?Home Equipment: Rollator (4 wheels);Cane - single point (can borrow [facility]) ?  ?  ?  ? ?  ?Prior Functioning/Environment Prior Level of Function : Independent/Modified Independent ?  ?  ?  ?  ?  ?  ?Mobility Comments: Normally no AD use but will use  SPC if feels dizzy or off-balance ?  ?  ? ?  ?  ?OT Problem List: Decreased strength;Decreased range of motion;Pain;Cardiopulmonary status limiting activity;Impaired balance (sitting and/or standing);Decreased knowledge of use of DME or AE;Decreased knowledge of precautions;Decreased safety awareness ?  ?   ?OT Treatment/Interventions: Self-care/ADL training;Therapeutic exercise;Therapeutic activities;DME and/or AE instruction;Patient/family education;Balance training  ?  ?OT Goals(Current goals can be found in the care plan section) Acute Rehab OT Goals ?Patient Stated Goal: feel better ?OT Goal Formulation: With patient ?Time For Goal Achievement: 05/28/21 ?Potential to Achieve Goals: Good ?ADL Goals ?Pt Will Perform Lower Body Dressing: with min assist;sit to/from stand;with adaptive equipment ?Pt Will Transfer to Toilet: with min guard assist;stand pivot transfer;bedside commode (LRAD) ?Pt Will Perform Toileting - Clothing Manipulation and hygiene: sitting/lateral leans;sit to/from stand;with supervision ?Additional ADL Goal #1: Pt will independently instruct family/caregiver in polar care mgt and compression stocking mgt  ?OT Frequency: Min 2X/week ?  ? ?Co-evaluation PT/OT/SLP Co-Evaluation/Treatment: Yes ?Reason for Co-Treatment: For patient/therapist safety;To address functional/ADL transfers ?PT goals addressed during session: Mobility/safety with mobility;Balance ?OT goals addressed during session: ADL's and self-care;Proper use of Adaptive equipment and DME ?  ? ?  ?  AM-PAC OT "6 Clicks" Daily Activity     ?Outcome Measure Help from another person eating meals?: None ?Help from another person taking care of personal grooming?: A Little ?Help from another person toileting, which includes using toliet, bedpan, or urinal?: A Lot ?Help from another person bathing (including washing, rinsing, drying)?: A Lot ?Help from another person to put on and taking off regular upper body clothing?: A Little ?Help from  another person to put on and taking off regular lower body clothing?: A Lot ?6 Click Score: 16 ?  ?End of Session Equipment Utilized During Treatment: Gait belt;Rolling walker (2 wheels) ?Nurse Communicati

## 2021-05-14 NOTE — Progress Notes (Signed)
? ?Subjective: ?1 Day Post-Op Procedure(s) (LRB): ?TOTAL KNEE ARTHROPLASTY (Right) ?Patient reports pain as moderate.   ?Patient is well, and has had no acute complaints or problems ?Denies any CP, SOB, ABD pain. ?We will continue therapy today.  ?Plan is to go Home after hospital stay. ? ?Objective: ?Vital signs in last 24 hours: ?Temp:  [96.9 ?F (36.1 ?C)-98.6 ?F (37 ?C)] 98.6 ?F (37 ?C) (03/17 0513) ?Pulse Rate:  [45-61] 52 (03/17 0513) ?Resp:  [10-33] 16 (03/17 0513) ?BP: (102-168)/(49-80) 114/49 (03/17 0513) ?SpO2:  [87 %-100 %] 98 % (03/17 0513) ?Weight:  [68 kg] 68 kg (03/16 0917) ? ?Intake/Output from previous day: ?03/16 0701 - 03/17 0700 ?In: 5732 [P.O.:120; I.V.:1513; IV Piggyback:200] ?Out: 1450 [Urine:1350; Blood:100] ?Intake/Output this shift: ?No intake/output data recorded. ? ?Recent Labs  ?  05/13/21 ?1458 05/14/21 ?0339  ?HGB 9.5* 8.1*  ? ?Recent Labs  ?  05/13/21 ?1458 05/14/21 ?0339  ?WBC 5.5 6.6  ?RBC 3.00* 2.61*  ?HCT 29.2* 25.5*  ?PLT 162 147*  ? ?Recent Labs  ?  05/13/21 ?1458 05/14/21 ?0339  ?NA  --  137  ?K  --  3.8  ?CL  --  104  ?CO2  --  29  ?BUN  --  18  ?CREATININE 0.76 1.03*  ?GLUCOSE  --  161*  ?CALCIUM  --  8.2*  ? ?No results for input(s): LABPT, INR in the last 72 hours. ? ?EXAM ?General - Patient is Alert, Appropriate, and Oriented ?Extremity - Sensation intact distally ?Intact pulses distally ?Dorsiflexion/Plantar flexion intact ?Incision: no drainage ?No cellulitis present ?Compartment soft ?Dressing - dressing C/D/I and no drainage, Praveena intact without drainage ?Motor Function - intact, moving foot and toes well on exam.  ? ?Past Medical History:  ?Diagnosis Date  ? Anginal pain ([place])   ? Aortic atherosclerosis ([place])   ? B12 deficiency   ? Basal cell carcinoma (BCC) of skin of nose   ? Bilateral cataracts   ? a.) s/p extraction  ? Bilateral occipital neuralgia   ? Chondrocalcinosis   ? Coronary artery disease 07/21/2016  ? a.) LHC 07/21/2016: EF 55-65%; LVEDP norm; 25%  pLCx; no intervention.  ? Diastolic dysfunction 20/25/4270  ? a.) TTE 03/17/2017: EF 45%; mild LVH, mod LA enlargement, global HK, G1DD. b.) TTE 12/12/2017: EF 45%; global HK, mild LVH adn LA enlargement; triv PR, mild AR/MR, mod TR; G1DD. c.) TTE 05/13/2019: EF 45%; mild LVH, mild LA enlargement; triv PR, mild MR/TR, mod AR; G1DD.  ? GERD (gastroesophageal reflux disease)   ? Headache disorder   ? HLD (hyperlipidemia)   ? Hypertension   ? LBBB (left bundle branch block)   ? Memory difficulty   ? Osteoarthritis   ? Palpitations   ? Pulmonary HTN ([place]) 03/17/2017  ? a.) TTE 03/17/2017: EF 45%; RVSP 36.9 mmHg. b.) TTE 12/15/2017: EF 45%; RVSP 40.3 mmHg. c.) TTE 05/13/2019; EF 45%; EF 45%; RVSP 42.0 mmHg  ? PVD (peripheral vascular disease) ([employer])   ? Rheumatoid arthritis ([place])   ? ? ?Assessment/Plan:   ?1 Day Post-Op Procedure(s) (LRB): ?TOTAL KNEE ARTHROPLASTY (Right) ?Principal Problem: ?  S/P TKR (total knee replacement) using cement, right ? ?Estimated body mass index is 25.75 kg/m? as calculated from the following: ?  Height as of this encounter: '5\' 4"'$  (1.626 m). ?  Weight as of this encounter: 68 kg. ?Advance diet ?Up with therapy ?Pain well controlled ?Vital signs are stable ?Acute postop blood loss anemia -hemoglobin 8.1, start iron supplement  and recheck hemoglobin tomorrow ?Care management to assist with discharge to home with home health PT.  Likely discharge to home Saturday or Sunday pending progress with PT ? ?DVT Prophylaxis - Lovenox, TED hose, and SCDs ?Weight-Bearing as tolerated to right leg ? ? ?T. Rachelle Hora, PA-C ?[place] ?05/14/2021, 8:09 AM ?  ?

## 2021-05-14 NOTE — Discharge Summary (Addendum)
?Physician Discharge Summary  ?Patient ID: ?Alicia Lambert ?MRN: [id number] ?DOB/AGE: 02-Jan-1928 88 y.o. ? ?Admit date: 05/13/2021 ?Discharge date: 05/20/2021 ? ?Admission Diagnoses:  ?S/P TKR (total knee replacement) using cement, right [Z96.651] ? ? ?Discharge Diagnoses: ?Patient Active Problem List  ? Diagnosis Date Noted  ? S/P TKR (total knee replacement) using cement, right 05/13/2021  ? Ganglion cyst of finger of right hand 07/08/2019  ? CAD (coronary artery disease) 07/08/2019  ? Elevated troponin 07/22/2016  ? ? ?Past Medical History:  ?Diagnosis Date  ? Anginal pain ([place])   ? Aortic atherosclerosis ([place])   ? B12 deficiency   ? Basal cell carcinoma (BCC) of skin of nose   ? Bilateral cataracts   ? a.) s/p extraction  ? Bilateral occipital neuralgia   ? Chondrocalcinosis   ? Coronary artery disease 07/21/2016  ? a.) LHC 07/21/2016: EF 55-65%; LVEDP norm; 25% pLCx; no intervention.  ? Diastolic dysfunction 58/10/9831  ? a.) TTE 03/17/2017: EF 45%; mild LVH, mod LA enlargement, global HK, G1DD. b.) TTE 12/12/2017: EF 45%; global HK, mild LVH adn LA enlargement; triv PR, mild AR/MR, mod TR; G1DD. c.) TTE 05/13/2019: EF 45%; mild LVH, mild LA enlargement; triv PR, mild MR/TR, mod AR; G1DD.  ? GERD (gastroesophageal reflux disease)   ? Headache disorder   ? HLD (hyperlipidemia)   ? Hypertension   ? LBBB (left bundle branch block)   ? Memory difficulty   ? Osteoarthritis   ? Palpitations   ? Pulmonary HTN ([place]) 03/17/2017  ? a.) TTE 03/17/2017: EF 45%; RVSP 36.9 mmHg. b.) TTE 12/15/2017: EF 45%; RVSP 40.3 mmHg. c.) TTE 05/13/2019; EF 45%; EF 45%; RVSP 42.0 mmHg  ? PVD (peripheral vascular disease) ([place])   ? Rheumatoid arthritis ([place])   ? ?  ?Transfusion: none ?  ?Consultants (if any):  ? ?Discharged Condition: Improved ? ?Hospital Course: Alicia Lambert is an 88 y.o. female who was admitted 05/13/2021 with a diagnosis of S/P TKR (total knee replacement) using cement, right and went to the operating room on  05/13/2021 and underwent the above named procedures.  ?  ?Surgeries: Procedure(s): ?TOTAL KNEE ARTHROPLASTY on 05/13/2021 ?Patient tolerated the surgery well. Taken to PACU where she was stabilized and then transferred to the orthopedic floor. ? ?Started on Lovenox 30 mg q 12 hrs. Foot pumps applied bilaterally at 80 mm. Heels elevated on bed with rolled towels. No evidence of DVT. Negative [place]. ?Physical therapy started on day #1 for gait training and transfer. OT started day #1 for ADL and assisted devices. ? ?Patient's foley was d/c on day #1.  Patient with slow progress of physical therapy on postop day 1.  Patient with acute postop blood loss anemia with hemoglobin of 8.1 on postop day 1.  Patient was started on iron supplement.  Vital signs stable. ? ?On postop day 2, patient noted to have slight altered mental status, confusion.  Narcotics were decreased and by the end of the day mental status is back to baseline.  On postop day 3, 4, 5, 6, 7 patient was stable making slow progress of physical therapy.  Her labs and vital signs were stable.  Patient with a acute postop blood loss anemia with hemoglobin of 8.0 but on iron supplement, hemoglobin was stable. ? ? ? ?She was given perioperative antibiotics:  ?Anti-infectives (From admission, onward)  ? ? Start     Dose/Rate Route Frequency Ordered Stop  ? 05/13/21 1600  ceFAZolin (ANCEF) IVPB 1 g/50 mL  premix       ? 1 g ?100 mL/hr over 30 Minutes Intravenous Every 6 hours 05/13/21 1432 05/13/21 2254  ? 05/13/21 0905  ceFAZolin (ANCEF) 2-4 GM/100ML-% IVPB       ?Note to Pharmacy: Maryagnes Amos B: cabinet override  ?    05/13/21 0905 05/13/21 1011  ? 05/13/21 0600  ceFAZolin (ANCEF) IVPB 2g/100 mL premix       ? 2 g ?200 mL/hr over 30 Minutes Intravenous On call to O.R. 05/12/21 2321 05/13/21 1010  ? ?  ?. ? ?She was given sequential compression devices, early ambulation, and Lovenox, teds for DVT prophylaxis. ? ?She benefited maximally from the hospital stay  and there were no complications.   ? ?Recent vital signs:  ?Vitals:  ? 05/19/21 1935 05/20/21 0438  ?BP: 125/60 (!) 153/73  ?Pulse: 65 69  ?Resp: 20 16  ?Temp: 99 ?F (37.2 ?C) 98.7 ?F (37.1 ?C)  ?SpO2: 99% 94%  ? ? ?Recent laboratory studies:  ?Lab Results  ?Component Value Date  ? HGB 8.0 (L) 05/17/2021  ? HGB 8.1 (L) 05/15/2021  ? HGB 8.1 (L) 05/14/2021  ? ?Lab Results  ?Component Value Date  ? WBC 7.6 05/17/2021  ? PLT 182 05/17/2021  ? ?Lab Results  ?Component Value Date  ? INR 0.98 07/20/2016  ? ?Lab Results  ?Component Value Date  ? NA 135 05/17/2021  ? K 3.9 05/17/2021  ? CL 103 05/17/2021  ? CO2 26 05/17/2021  ? BUN 13 05/17/2021  ? CREATININE 0.92 05/20/2021  ? GLUCOSE 94 05/17/2021  ? ? ?Discharge Medications:   ?Allergies as of 05/20/2021   ? ?   Reactions  ? Atorvastatin   ? Other reaction(s): Muscle Pain  ? Methotrexate Sodium Nausea Only  ? Had been having side effects (trouble with vision, nervous and digestive systems, nausea, lost around 15 pounds), so stopped taking it in October 2017.   ? Nsaids   ? Other reaction(s): Other (See Comments)  ? Risedronate Diarrhea  ? Tolmetin Nausea And Vomiting  ?   ? Celecoxib Anxiety  ? Ibuprofen Rash  ? ?  ? ?  ?Medication List  ?  ? ?STOP taking these medications   ? ?acetaminophen 650 MG CR tablet ?Commonly known as: TYLENOL ?  ? ?  ? ?TAKE these medications   ? ?aspirin 81 MG tablet ?Take 81 mg by mouth daily. ?  ?docusate sodium 100 MG capsule ?Commonly known as: COLACE ?Take 1 capsule (100 mg total) by mouth 2 (two) times daily. ?  ?enoxaparin 40 MG/0.4ML injection ?Commonly known as: LOVENOX ?Inject 0.4 mLs (40 mg total) into the skin daily for 14 days. ?  ?ferrous [id number] C-folic acid capsule ?Commonly known as: TRINSICON / FOLTRIN ?Take 1 capsule by mouth 2 (two) times daily after a meal. ?  ?gabapentin 100 MG capsule ?Commonly known as: NEURONTIN ?Take 100 mg by mouth See admin instructions. Take 100 mg in the morning and 200 mg in the  evening ?  ?HYDROcodone-acetaminophen 7.5-325 MG tablet ?Commonly known as: NORCO ?Take 1-2 tablets by mouth every 4 (four) hours as needed for severe pain (pain score 7-10). ?  ?loratadine 10 MG tablet ?Commonly known as: CLARITIN ?Take 10 mg by mouth daily as needed for allergies. ?  ?metoprolol tartrate 25 MG tablet ?Commonly known as: LOPRESSOR ?Take 25 mg by mouth 2 (two) times daily. ?  ?multivitamin with minerals tablet ?Take 1 tablet by mouth daily. ?  ?polyethylene glycol 17  g packet ?Commonly known as: MIRALAX / GLYCOLAX ?Take 17 g by mouth daily. ?  ?vitamin B-12 1000 MCG tablet ?Commonly known as: CYANOCOBALAMIN ?Take 1,000 mcg by mouth daily. ?  ? ?  ? ?  ?  ? ? ?  ?Durable Medical Equipment  ?(From admission, onward)  ?  ? ? ?  ? ?  Start     Ordered  ? 05/13/21 1150  DME Walker rolling  Once       ?Question Answer Comment  ?Walker: With 5 Inch Wheels   ?Patient needs a walker to treat with the following condition S/P TKR (total knee replacement) using cement, right   ?  ? 05/13/21 1149  ? 05/13/21 1150  DME 3 n 1  Once       ? 05/13/21 1149  ? 05/13/21 1150  DME Bedside commode  Once       ?Question:  Patient needs a bedside commode to treat with the following condition  Answer:  S/P TKR (total knee replacement) using cement, right  ? 05/13/21 1149  ? ?  ?  ? ?  ? ? ?Diagnostic Studies: DG Knee 1-2 Views Right ? ?Result Date: 05/13/2021 ?CLINICAL DATA:  Total knee replacement EXAM: RIGHT KNEE - 1-2 VIEW COMPARISON:  CT scan 03/25/2021 FINDINGS: Two-view portable study at 1202 hours shows tricompartmental knee replacement. Gas in the soft tissues and joint space is compatible with the recent surgery. Surgical drain overlies the distal thigh. No evidence for immediate hardware complication. IMPRESSION: Status post tricompartmental knee replacement. No evidence for immediate hardware complication. Electronically Signed   By: Misty Stanley M.D.   On: 05/13/2021 12:35   ? ?Disposition:  ? ? ? ? Contact  information for follow-up providers   ? ? Duanne Guess, PA-C Follow up in 2 week(s).   ?Specialties: Orthopedic Surgery, Emergency Medicine ?Contact information: ?[place] [address] ?[phone]-

## 2021-05-15 LAB — CBC
HCT: 25.7 % — ABNORMAL LOW (ref 36.0–46.0)
Hemoglobin: 8.1 g/dL — ABNORMAL LOW (ref 12.0–15.0)
MCH: 30.9 pg (ref 26.0–34.0)
MCHC: 31.5 g/dL (ref 30.0–36.0)
MCV: 98.1 fL (ref 80.0–100.0)
Platelets: 150 10*3/uL (ref 150–400)
RBC: 2.62 MIL/uL — ABNORMAL LOW (ref 3.87–5.11)
RDW: 12.5 % (ref 11.5–15.5)
WBC: 10.2 10*3/uL (ref 4.0–10.5)
nRBC: 0 % (ref 0.0–0.2)

## 2021-05-15 NOTE — TOC Progression Note (Addendum)
Transition of Care (TOC) - Progression Note  ? ? ?Patient Details  ?Name: Alicia Lambert ?MRN: [id number] ?Date of Birth: 1927/11/23 ? ?Transition of Care (TOC) CM/SW Contact  ?Rushawn Capshaw E Jaylena Holloway, LCSW ?Phone Number: ?05/15/2021, 10:12 AM ? ?Clinical Narrative:   PASRR screening completed. PASRR is [id number]. ?Following for bed offers. Asked Tammy with [facility] to re evaluate since per yesterday they are taking some [employer] patients. ? ? ? ?Expected Discharge Plan: [place] ?Barriers to Discharge: Continued Medical Work up, SNF Pending bed offer, Insurance Authorization ? ?Expected Discharge Plan and Services ?Expected Discharge Plan: [place] ?  ?Discharge Planning Services: CM Consult ?  ?Living arrangements for the past 2 months: [place] ?                ?DME Arranged: N/A ?  ?  ?  ?  ?  ?[place] Agency: NA ?  ?  ?  ? ? ?Social Determinants of Health (SDOH) Interventions ?  ? ?Readmission Risk Interventions ?No flowsheet data found. ? ?

## 2021-05-15 NOTE — Plan of Care (Signed)

## 2021-05-15 NOTE — Progress Notes (Signed)
? ?Subjective: ?2 Days Post-Op Procedure(s) (LRB): ?TOTAL KNEE ARTHROPLASTY (Right) ?Patient reports pain as moderate.   ?Patient is well, and has had no acute complaints or problems ?Denies any CP, SOB, ABD pain. ?We will continue therapy today.  ?Plan is to go Home after hospital stay. ? ?Objective: ?Vital signs in last 24 hours: ?Temp:  [97.9 ?F (36.6 ?C)-98.7 ?F (37.1 ?C)] 98.7 ?F (37.1 ?C) (03/18 0419) ?Pulse Rate:  [53-71] 67 (03/18 0419) ?Resp:  [14-18] 18 (03/18 0419) ?BP: (99-143)/(44-80) 120/80 (03/18 0419) ?SpO2:  [90 %-100 %] 100 % (03/18 0419) ? ?Intake/Output from previous day: ?03/17 0701 - 03/18 0700 ?In: 480 [P.O.:480] ?Out: 650 [Urine:450; Emesis/NG output:200] ?Intake/Output this shift: ?No intake/output data recorded. ? ?Recent Labs  ?  05/13/21 ?1458 05/14/21 ?0339 05/15/21 ?0446  ?HGB 9.5* 8.1* 8.1*  ? ?Recent Labs  ?  05/14/21 ?0339 05/15/21 ?0446  ?WBC 6.6 10.2  ?RBC 2.61* 2.62*  ?HCT 25.5* 25.7*  ?PLT 147* 150  ? ?Recent Labs  ?  05/13/21 ?1458 05/14/21 ?0339  ?NA  --  137  ?K  --  3.8  ?CL  --  104  ?CO2  --  29  ?BUN  --  18  ?CREATININE 0.76 1.03*  ?GLUCOSE  --  161*  ?CALCIUM  --  8.2*  ? ?No results for input(s): LABPT, INR in the last 72 hours. ? ?EXAM ?General - Patient is Alert, Appropriate, and Oriented ?Extremity - Sensation intact distally ?Intact pulses distally ?Dorsiflexion/Plantar flexion intact ?Incision: no drainage ?No cellulitis present ?Compartment soft ?Dressing - dressing C/D/I and no drainage, Praveena intact without drainage ?Motor Function - intact, moving foot and toes well on exam.  ? ?Past Medical History:  ?Diagnosis Date  ? Anginal pain ([place])   ? Aortic atherosclerosis ([place])   ? B12 deficiency   ? Basal cell carcinoma (BCC) of skin of nose   ? Bilateral cataracts   ? a.) s/p extraction  ? Bilateral occipital neuralgia   ? Chondrocalcinosis   ? Coronary artery disease 07/21/2016  ? a.) LHC 07/21/2016: EF 55-65%; LVEDP norm; 25% pLCx; no intervention.  ?  Diastolic dysfunction 19/62/2297  ? a.) TTE 03/17/2017: EF 45%; mild LVH, mod LA enlargement, global HK, G1DD. b.) TTE 12/12/2017: EF 45%; global HK, mild LVH adn LA enlargement; triv PR, mild AR/MR, mod TR; G1DD. c.) TTE 05/13/2019: EF 45%; mild LVH, mild LA enlargement; triv PR, mild MR/TR, mod AR; G1DD.  ? GERD (gastroesophageal reflux disease)   ? Headache disorder   ? HLD (hyperlipidemia)   ? Hypertension   ? LBBB (left bundle branch block)   ? Memory difficulty   ? Osteoarthritis   ? Palpitations   ? Pulmonary HTN ([place]) 03/17/2017  ? a.) TTE 03/17/2017: EF 45%; RVSP 36.9 mmHg. b.) TTE 12/15/2017: EF 45%; RVSP 40.3 mmHg. c.) TTE 05/13/2019; EF 45%; EF 45%; RVSP 42.0 mmHg  ? PVD (peripheral vascular disease) ([place])   ? Rheumatoid arthritis ([place])   ? ? ?Assessment/Plan:   ?2 Days Post-Op Procedure(s) (LRB): ?TOTAL KNEE ARTHROPLASTY (Right) ?Principal Problem: ?  S/P TKR (total knee replacement) using cement, right ? ?Estimated body mass index is 25.75 kg/m? as calculated from the following: ?  Height as of this encounter: '5\' 4"'$  (1.626 m). ?  Weight as of this encounter: 68 kg. ?Advance diet ?Up with therapy ?Pain well controlled ?Vital signs are stable ?Acute postop blood loss anemia -hemoglobin 8.1, stable, continue iron supplement and recheck hemoglobin tomorrow ?Care management  to assist with discharge to rehab.  Probable Monday. ? ?DVT Prophylaxis - Lovenox, TED hose, and SCDs ?Weight-Bearing as tolerated to right leg ? ? ?Reche Dixon PA-C ?[place] ?05/15/2021, 7:15 AM ?  ?

## 2021-05-15 NOTE — Progress Notes (Signed)
Physical Therapy Treatment ?Patient Details ?Name: Alicia Lambert ?MRN: [id number] ?DOB: [birth year] ?Today's Date: 05/15/2021 ? ? ?History of Present Illness Pt is a 88 y.o. female s/p R TKA 05/13/21 secondary primary localized OA.  PMH includes CAD, LBBB, PVD, aortic atherosclerosis, angina, diastolic dysfunction, pulmonary hypertension, palpitations, HTN, HLD, GERD, OA, RA, memory difficulties, and back surgery. ? ?  ?PT Comments  ? ? Pt was still sitting in recliner from AM session. She is much more fatigued than earlier session and Pryor Curia feels cognition was slightly more altered due to fatigue. She remained oriented but does say a few off the wall comments that she did not have in earlier session. She requested to return to bed to take a nap. Prior to transferring back to bed, pt performed several ROM exercises. She was able to flex knee to ~ 88 degrees. Session progressed to standing and taking several, labored shuffling steps to EOB. Pt did have improved ability to clear LLE but still overall limited by pain. She required mod assist to get BLEs into bed from EOB short sit. Overall pt tolerated session well. Continued recommendation for rehab at DC.  ?  ?Recommendations for follow up therapy are one component of a multi-disciplinary discharge planning process, led by the attending physician.  Recommendations may be updated based on patient status, additional functional criteria and insurance authorization. ? ?Follow Up Recommendations ? Skilled nursing-short term rehab (<3 hours/day) ?  ?  ?Assistance Recommended at Discharge Frequent or constant Supervision/Assistance  ?Patient can return home with the following A lot of help with walking and/or transfers;A lot of help with bathing/dressing/bathroom;Assistance with cooking/housework;Assist for transportation;Help with stairs or ramp for entrance;Direct supervision/assist for medications management ?  ?Equipment Recommendations ? Rolling walker (2  wheels);BSC/3in1  ?  ?   ?Precautions / Restrictions Precautions ?Precautions: Fall ?Precaution Comments: wound vac ?Restrictions ?Weight Bearing Restrictions: Yes ?RLE Weight Bearing: Weight bearing as tolerated  ?  ? ?Mobility ? Bed Mobility ?Overal bed mobility: Needs Assistance ?Bed Mobility: Sit to Supine ?  ?  ?Supine to sit: Mod assist ?Sit to supine: Mod assist ?  ?General bed mobility comments: Pt required assistance to progress BLEs into bed from EOB sitting. ?  ? ?Transfers ?Overall transfer level: Needs assistance ?Equipment used: Rolling walker (2 wheels) ?Transfers: Sit to/from Stand ?Sit to Stand: Mod assist, Max assist ?  ?  ?  ?  ?  ?General transfer comment: Pt required more assistance to stand from lower recliner surface. Vcs for handplacement and fwd wt shift. ?  ? ?Ambulation/Gait ?Ambulation/Gait assistance: Min assist, Mod assist ?Gait Distance (Feet): 3 Feet ?Assistive device: Rolling walker (2 wheels) ?Gait Pattern/deviations: Shuffle, Antalgic, Step-to pattern ?Gait velocity: decreased ?  ?  ?General Gait Details: Slightly improved ability to lift LLE to take steps( tolerated wt on operative leg this afternoon better however still unable to ambulate away from EOB/recliner) ? ? ?  ?Balance Overall balance assessment: Needs assistance ?Sitting-balance support: No upper extremity supported, Feet supported ?Sitting balance-Leahy Scale: Good ?  ?  ?Standing balance support: Bilateral upper extremity supported, During functional activity, Reliant on assistive device for balance ?Standing balance-Leahy Scale: Fair ?Standing balance comment: completely reliant on RW for all standing activity ?  ?   ?Cognition Arousal/Alertness: Awake/alert ?Behavior During Therapy: [facility] for tasks assessed/performed ?Overall Cognitive Status: Within Functional Limits for tasks assessed ?  ?   ?General Comments: Pt is alert however does present with slightly more altered mental status. Author questions if  its due to  lack of sleep and overall fatigue. ?  ?  ? ?  ?Exercises Total Joint Exercises ?Goniometric ROM: ~ 88 degrees ? ?  ?General Comments General comments (skin integrity, edema, etc.): reviewed polar care, importance of positioning, and Need for ther ex to progress ROM and strength. Will return for PM session to address ROm deficits. ?  ?  ? ?Pertinent Vitals/Pain Pain Assessment ?Pain Assessment: 0-10 ?Pain Score: 6  ?Faces Pain Scale: Hurts little more ?Pain Location: R knee ?Pain Descriptors / Indicators: Aching, Sore, Tender ?Pain Intervention(s): Limited activity within patient's tolerance, Monitored during session, Premedicated before session, Repositioned, Ice applied  ? ? ? ?PT Goals (current goals can now be found in the care plan section) Acute Rehab PT Goals ?Patient Stated Goal: none stated ?Progress towards PT goals: Progressing toward goals ? ?  ?Frequency ? ? ? BID ? ? ? ?  ?PT Plan Current plan remains appropriate  ? ? ?Co-evaluation   ?  ?PT goals addressed during session: Mobility/safety with mobility;Strengthening/ROM;Proper use of DME;Balance ?  ?  ? ?  ?AM-PAC PT "6 Clicks" Mobility   ?Outcome Measure ? Help needed turning from your back to your side while in a flat bed without using bedrails?: None ?Help needed moving from lying on your back to sitting on the side of a flat bed without using bedrails?: A Lot ?Help needed moving to and from a bed to a chair (including a wheelchair)?: A Lot ?Help needed standing up from a chair using your arms (e.g., wheelchair or bedside chair)?: A Lot ?Help needed to walk in hospital room?: A Lot ?Help needed climbing 3-5 steps with a railing? : Total ?6 Click Score: 13 ? ?  ?End of Session   ?Activity Tolerance: Patient tolerated treatment well;Patient limited by fatigue ?Patient left: in bed;with call bell/phone within reach;with bed alarm set ?Nurse Communication: Mobility status;Precautions;Weight bearing status;Other (comment) ?PT Visit Diagnosis: Unsteadiness  on feet (R26.81);Other abnormalities of gait and mobility (R26.89);Muscle weakness (generalized) (M62.81);Pain ?Pain - Right/Left: Right ?Pain - part of body: Knee ?  ? ? ?Time: 3329-5188 ?PT Time Calculation (min) (ACUTE ONLY): 16 min ? ?Charges:  $Therapeutic Activity: 8-22 mins          ?          ?Julaine Fusi PTA ?05/15/21, 4:10 PM  ? ?

## 2021-05-15 NOTE — Progress Notes (Signed)
Physical Therapy Treatment ?Patient Details ?Name: Alicia Lambert ?MRN: [id number] ?DOB: [birth year] ?Today's Date: 05/15/2021 ? ? ?History of Present Illness Pt is a 88 y.o. female s/p R TKA 05/13/21 secondary primary localized OA.  PMH includes CAD, LBBB, PVD, aortic atherosclerosis, angina, diastolic dysfunction, pulmonary hypertension, palpitations, HTN, HLD, GERD, OA, RA, memory difficulties, and back surgery. ? ?  ?PT Comments  ? ? Pt was long sitting in bed upon arriving. Supportive son present and RN in room. Pt was pre-medicated prior to session and will need to be for future sessions. She is A and O x 3 and follow commands consistently throughout. Pain limited throughout even after taking pain medicine. She was able to exit R side of bed with increased time + mod assist and vcs for improved sequencing. Sat EOB x several minutes prior to STS 3 x EOB. Pt was able to clear RLE from floor easily however struggles to clear non operative LE due to wt bearing on RLE. Session did progress taking several shuffling steps to recliner with increased time and vcs for improve technique. Pt does fatigue quickly in standing but mostly is pain limited. Will return later this date to advance ROM/strength and continue to follow per current POC. SNF at DC remains most appropriate DC disposition.  ?  ?Recommendations for follow up therapy are one component of a multi-disciplinary discharge planning process, led by the attending physician.  Recommendations may be updated based on patient status, additional functional criteria and insurance authorization. ? ?Follow Up Recommendations ? Skilled nursing-short term rehab (<3 hours/day) ?  ?  ?Assistance Recommended at Discharge Frequent or constant Supervision/Assistance  ?Patient can return home with the following A lot of help with walking and/or transfers;A lot of help with bathing/dressing/bathroom;Assistance with cooking/housework;Assist for transportation;Help with stairs or  ramp for entrance;Direct supervision/assist for medications management ?  ?Equipment Recommendations ? Rolling walker (2 wheels);BSC/3in1  ?  ?   ?Precautions / Restrictions Precautions ?Precautions: Fall ?Precaution Comments: wound vac ?Restrictions ?Weight Bearing Restrictions: Yes ?RLE Weight Bearing: Weight bearing as tolerated  ?  ? ?Mobility ? Bed Mobility ?Overal bed mobility: Needs Assistance ?Bed Mobility: Supine to Sit ?  ?  ?Supine to sit: Mod assist ?  ?  ?General bed mobility comments: Pt required increased time and mod assist to safely exit R side of bed. Vcs for sequencing improvements ?  ? ?Transfers ?Overall transfer level: Needs assistance ?Equipment used: Rolling walker (2 wheels) ?Transfers: Sit to/from Stand ?Sit to Stand: Min assist, Mod assist, From elevated surface ?  ? General transfer comment: Pt was able to stand 3 x EOB to RW. pt is easily able to lift operative LE however struggles with wt tolerance on RLE to lift LLE. pt mostly shuffles on floor to progress to recliner ?  ? ?Ambulation/Gait ?Ambulation/Gait assistance: Mod assist ?Gait Distance (Feet): 3 Feet ?Assistive device: Rolling walker (2 wheels) ?Gait Pattern/deviations: Shuffle, Antalgic, Step-to pattern ?Gait velocity: decreased ?  ?  ?General Gait Details: Pt was able to take shuffling steps to recliner however poor foot clearance due to pain ? ?  ?Balance Overall balance assessment: Needs assistance ?Sitting-balance support: No upper extremity supported, Feet supported ?Sitting balance-Leahy Scale: Good ?  ?  ?Standing balance support: Bilateral upper extremity supported, During functional activity, Reliant on assistive device for balance ?Standing balance-Leahy Scale: Fair ?Standing balance comment: completely reliant on RW for all standing activity ?  ?  ?  ?  ?Cognition Arousal/Alertness: Awake/alert ?Behavior During Therapy:  WFL for tasks assessed/performed ?Overall Cognitive Status: Within Functional Limits for tasks  assessed ?  ?   ?General Comments: Pt is alert and conversational throughout. Oriented x 3. Cooperative and motivated throughout. supportive son present during most of session ?  ?  ? ?  ?   ?General Comments General comments (skin integrity, edema, etc.): reviewed polar care, importance of positioning, and Need for ther ex to progress ROM and strength. Will return for PM session to address ROm deficits. ?  ?  ? ?Pertinent Vitals/Pain Pain Assessment ?Pain Assessment: 0-10 ?Pain Score: 6  ?Faces Pain Scale: Hurts even more (During wt bearing) ?Pain Location: R knee ?Pain Descriptors / Indicators: Aching, Sore, Tender ?Pain Intervention(s): Limited activity within patient's tolerance, Monitored during session, Premedicated before session, Repositioned  ? ? ? ?PT Goals (current goals can now be found in the care plan section) Acute Rehab PT Goals ?Patient Stated Goal: to improve mobility ?Progress towards PT goals: Progressing toward goals ? ?  ?Frequency ? ? ? BID ? ? ? ?  ?PT Plan Current plan remains appropriate  ? ? ?Co-evaluation   ?  ?PT goals addressed during session: Mobility/safety with mobility;Balance;Proper use of DME;Strengthening/ROM ?  ?  ? ?  ?AM-PAC PT "6 Clicks" Mobility   ?Outcome Measure ? Help needed turning from your back to your side while in a flat bed without using bedrails?: None ?Help needed moving from lying on your back to sitting on the side of a flat bed without using bedrails?: A Lot ?Help needed moving to and from a bed to a chair (including a wheelchair)?: A Lot ?Help needed standing up from a chair using your arms (e.g., wheelchair or bedside chair)?: A Lot ?Help needed to walk in hospital room?: A Lot ?Help needed climbing 3-5 steps with a railing? : Total ?6 Click Score: 13 ? ?  ?End of Session   ?Activity Tolerance: Patient tolerated treatment well;Patient limited by pain ?Patient left: in chair;with call bell/phone within reach;with chair alarm set;with family/visitor  present ?Nurse Communication: Mobility status;Precautions;Weight bearing status;Other (comment) ?PT Visit Diagnosis: Unsteadiness on feet (R26.81);Other abnormalities of gait and mobility (R26.89);Muscle weakness (generalized) (M62.81);Pain ?Pain - Right/Left: Right ?Pain - part of body: Knee ?  ? ? ?Time: 1517-6160 ?PT Time Calculation (min) (ACUTE ONLY): 28 min ? ?Charges:  $Therapeutic Activity: 23-37 mins          ?          ? ?Julaine Fusi PTA ?05/15/21, 1:09 PM  ? ?

## 2021-05-16 NOTE — Progress Notes (Signed)
? ?Subjective: ?3 Days Post-Op Procedure(s) (LRB): ?TOTAL KNEE ARTHROPLASTY (Right) ?Patient reports pain as mild to moderate.  Less confused this morning. ?Patient is well, and has had no acute complaints or problems ?Denies any CP, SOB, ABD pain. ?We will continue therapy today.  ?Plan is to go Home after hospital stay. ? ?Objective: ?Vital signs in last 24 hours: ?Temp:  [98.1 ?F (36.7 ?C)-99.5 ?F (37.5 ?C)] 98.4 ?F (36.9 ?C) (03/19 0720) ?Pulse Rate:  [64-88] 66 (03/19 0720) ?Resp:  [16-18] 18 (03/19 0720) ?BP: (124-149)/(47-61) 146/47 (03/19 0720) ?SpO2:  [68 %-100 %] 100 % (03/19 0720) ? ?Intake/Output from previous day: ?03/18 0701 - 03/19 0700 ?In: 1142.4 [P.O.:240; I.V.:902.4] ?Out: 600 [Urine:600] ?Intake/Output this shift: ?No intake/output data recorded. ? ?Recent Labs  ?  05/13/21 ?1458 05/14/21 ?0339 05/15/21 ?0446  ?HGB 9.5* 8.1* 8.1*  ? ?Recent Labs  ?  05/14/21 ?0339 05/15/21 ?0446  ?WBC 6.6 10.2  ?RBC 2.61* 2.62*  ?HCT 25.5* 25.7*  ?PLT 147* 150  ? ?Recent Labs  ?  05/13/21 ?1458 05/14/21 ?0339  ?NA  --  137  ?K  --  3.8  ?CL  --  104  ?CO2  --  29  ?BUN  --  18  ?CREATININE 0.76 1.03*  ?GLUCOSE  --  161*  ?CALCIUM  --  8.2*  ? ?No results for input(s): LABPT, INR in the last 72 hours. ? ?EXAM ?General - Patient is Alert, Appropriate, and Oriented ?Extremity - Sensation intact distally ?Intact pulses distally ?Dorsiflexion/Plantar flexion intact ?Incision: no drainage ?No cellulitis present ?Compartment soft ?Dressing - dressing C/D/I and no drainage, Praveena intact without drainage ?Motor Function - intact, moving foot and toes well on exam.  Ambulated 3 feet with physical therapy. ? ?Past Medical History:  ?Diagnosis Date  ? Anginal pain ([place])   ? Aortic atherosclerosis ([place])   ? B12 deficiency   ? Basal cell carcinoma (BCC) of skin of nose   ? Bilateral cataracts   ? a.) s/p extraction  ? Bilateral occipital neuralgia   ? Chondrocalcinosis   ? Coronary artery disease 07/21/2016  ? a.) LHC  07/21/2016: EF 55-65%; LVEDP norm; 25% pLCx; no intervention.  ? Diastolic dysfunction 92/12/9415  ? a.) TTE 03/17/2017: EF 45%; mild LVH, mod LA enlargement, global HK, G1DD. b.) TTE 12/12/2017: EF 45%; global HK, mild LVH adn LA enlargement; triv PR, mild AR/MR, mod TR; G1DD. c.) TTE 05/13/2019: EF 45%; mild LVH, mild LA enlargement; triv PR, mild MR/TR, mod AR; G1DD.  ? GERD (gastroesophageal reflux disease)   ? Headache disorder   ? HLD (hyperlipidemia)   ? Hypertension   ? LBBB (left bundle branch block)   ? Memory difficulty   ? Osteoarthritis   ? Palpitations   ? Pulmonary HTN ([place]) 03/17/2017  ? a.) TTE 03/17/2017: EF 45%; RVSP 36.9 mmHg. b.) TTE 12/15/2017: EF 45%; RVSP 40.3 mmHg. c.) TTE 05/13/2019; EF 45%; EF 45%; RVSP 42.0 mmHg  ? PVD (peripheral vascular disease) ([place])   ? Rheumatoid arthritis ([place])   ? ? ?Assessment/Plan:   ?3 Days Post-Op Procedure(s) (LRB): ?TOTAL KNEE ARTHROPLASTY (Right) ?Principal Problem: ?  S/P TKR (total knee replacement) using cement, right ? ?Estimated body mass index is 25.75 kg/m? as calculated from the following: ?  Height as of this encounter: '5\' 4"'$  (1.626 m). ?  Weight as of this encounter: 68 kg. ?Advance diet ?Up with therapy ?Pain well controlled ?Vital signs are stable ?Acute postop blood loss anemia -hemoglobin 8.1,  stable, continue iron supplement and recheck hemoglobin tomorrow ?Care management to assist with discharge to rehab.  Probable Monday. ? ?DVT Prophylaxis - Lovenox, TED hose, and SCDs ?Weight-Bearing as tolerated to right leg ? ? ?Reche Dixon PA-C ?[place] ?05/16/2021, 7:22 AM ?  ?

## 2021-05-17 LAB — BASIC METABOLIC PANEL
Anion gap: 6 (ref 5–15)
BUN: 13 mg/dL (ref 8–23)
CO2: 26 mmol/L (ref 22–32)
Calcium: 8.2 mg/dL — ABNORMAL LOW (ref 8.9–10.3)
Chloride: 103 mmol/L (ref 98–111)
Creatinine, Ser: 0.91 mg/dL (ref 0.44–1.00)
GFR, Estimated: 60 mL/min — ABNORMAL LOW (ref >60)
Glucose, Bld: 94 mg/dL (ref 70–99)
Potassium: 3.9 mmol/L (ref 3.5–5.1)
Sodium: 135 mmol/L (ref 135–145)

## 2021-05-17 LAB — CBC
HCT: 25.4 % — ABNORMAL LOW (ref 36.0–46.0)
Hemoglobin: 8 g/dL — ABNORMAL LOW (ref 12.0–15.0)
MCH: 31.7 pg (ref 26.0–34.0)
MCHC: 31.5 g/dL (ref 30.0–36.0)
MCV: 100.8 fL — ABNORMAL HIGH (ref 80.0–100.0)
Platelets: 182 10*3/uL (ref 150–400)
RBC: 2.52 MIL/uL — ABNORMAL LOW (ref 3.87–5.11)
RDW: 12.7 % (ref 11.5–15.5)
WBC: 7.6 10*3/uL (ref 4.0–10.5)
nRBC: 0 % (ref 0.0–0.2)

## 2021-05-17 MED ORDER — BISACODYL 10 MG RE SUPP
10.0000 mg | Freq: Once | RECTAL | Status: AC
  Administered 2021-05-17: 10 mg via RECTAL
  Filled 2021-05-17: qty 1

## 2021-05-17 NOTE — Progress Notes (Signed)
Occupational Therapy Treatment ?Patient Details ?Name: Alicia Lambert ?MRN: [id number] ?DOB: [birth year] ?Today's Date: 05/17/2021 ? ? ?History of present illness Pt is a 88 y.o. female s/p R TKA 05/13/21 secondary primary localized OA.  PMH includes CAD, LBBB, PVD, aortic atherosclerosis, angina, diastolic dysfunction, pulmonary hypertension, palpitations, HTN, HLD, GERD, OA, RA, memory difficulties, and back surgery. ?  ?OT comments ? Pt seen for OT tx this afternoon. Family member present. Pt declining EOB/OOB 2/2 fatigue. With encouragement from OT and family member, pt agreeable to improving her positioning in the bed for maximizing safety while drinking her coffee. Pt instructed in bed mobility and able to complete with bed flat and VC for hand placement and LLE placement to help herself pull up in bed. Pt also instructed in BLE exercises and pt able to return demo BUE ex she has been performing. Pt continues to benefit from OT services, continue to recommend STR at this time.   ? ?Recommendations for follow up therapy are one component of a multi-disciplinary discharge planning process, led by the attending physician.  Recommendations may be updated based on patient status, additional functional criteria and insurance authorization. ?   ?Follow Up Recommendations ? Skilled nursing-short term rehab (<3 hours/day)  ?  ?Assistance Recommended at Discharge Frequent or constant Supervision/Assistance  ?Patient can return home with the following ? A lot of help with walking and/or transfers;A lot of help with bathing/dressing/bathroom;Assistance with cooking/housework;Assist for transportation;Help with stairs or ramp for entrance;Direct supervision/assist for financial management;Direct supervision/assist for medications management ?  ?Equipment Recommendations ? BSC/3in1  ?  ?Recommendations for Other Services   ? ?  ?Precautions / Restrictions Precautions ?Precautions: Fall ?Precaution Comments: wound  vac ?Restrictions ?Weight Bearing Restrictions: Yes ?RLE Weight Bearing: Weight bearing as tolerated  ? ? ?  ? ?Mobility Bed Mobility ?  ?  ?  ?  ?  ?  ?  ?General bed mobility comments: With bed laid flat, pt able to pull herself up with VC for hand placement and use of LLE to help bridge hips; pt declined EOB/OOB ?  ? ?Transfers ?  ?  ?  ?  ?  ?  ?  ?  ?  ?  ?  ?  ?Balance   ?  ?  ?  ?  ?  ?  ?  ?  ?  ?  ?  ?  ?  ?  ?  ?  ?  ?  ?   ? ?ADL either performed or assessed with clinical judgement  ? ?ADL   ?  ?  ?  ?  ?  ?  ?  ?  ?  ?  ?  ?  ?  ?  ?  ?  ?  ?  ?  ?  ?  ? ?Extremity/Trunk Assessment   ?  ?  ?  ?  ?  ? ?Vision   ?  ?  ?Perception   ?  ?Praxis   ?  ? ?Cognition Arousal/Alertness: Awake/alert ?Behavior During Therapy: [facility] for tasks assessed/performed ?Overall Cognitive Status: Within Functional Limits for tasks assessed ?  ?  ?  ?  ?  ?  ?  ?  ?  ?  ?  ?  ?  ?  ?  ?  ?  ?  ?  ?   ?Exercises Other Exercises ?Other Exercises: Pt instructed in bed mobility, BLE exercises to support goals, and pt able to return demo BUE ex  she has been performing ? ?  ?Shoulder Instructions   ? ? ?  ?General Comments    ? ? ?Pertinent Vitals/ Pain       Pain Assessment ?Pain Assessment: 0-10 ?Pain Score: 3  ?Pain Location: headache ?Pain Descriptors / Indicators: Headache ?Pain Intervention(s): Limited activity within patient's tolerance, Monitored during session, Premedicated before session, Repositioned ? ?Home Living   ?  ?  ?  ?  ?  ?  ?  ?  ?  ?  ?  ?  ?  ?  ?  ?  ?  ?  ? ?  ?Prior Functioning/Environment    ?  ?  ?  ?   ? ?Frequency ? Min 2X/week  ? ? ? ? ?  ?Progress Toward Goals ? ?OT Goals(current goals can now be found in the care plan section) ? Progress towards OT goals: Progressing toward goals ? ?Acute Rehab OT Goals ?Patient Stated Goal: feel better ?OT Goal Formulation: With patient ?Time For Goal Achievement: 05/28/21 ?Potential to Achieve Goals: Good  ?Plan Discharge plan remains appropriate;Frequency remains  appropriate   ? ?Co-evaluation ? ? ?   ?  ?  ?  ?  ? ?  ?AM-PAC OT "6 Clicks" Daily Activity     ?Outcome Measure ? ? Help from another person eating meals?: None ?Help from another person taking care of personal grooming?: A Little ?Help from another person toileting, which includes using toliet, bedpan, or urinal?: A Lot ?Help from another person bathing (including washing, rinsing, drying)?: A Lot ?Help from another person to put on and taking off regular upper body clothing?: A Little ?Help from another person to put on and taking off regular lower body clothing?: A Lot ?6 Click Score: 16 ? ?  ?End of Session   ? ?OT Visit Diagnosis: Other abnormalities of gait and mobility (R26.89);Muscle weakness (generalized) (M62.81);Pain ?Pain - Right/Left: Right ?Pain - part of body: Knee ?  ?Activity Tolerance Patient tolerated treatment well ?  ?Patient Left in bed;with call bell/phone within reach;with bed alarm set;with family/visitor present ?  ?Nurse Communication   ?  ? ?   ? ?Time: 3790-2409 ?OT Time Calculation (min): 11 min ? ?Charges: OT General Charges ?$OT Visit: 1 Visit ?OT Treatments ?$Therapeutic Exercise: 8-22 mins ? ?Ardeth Perfect., MPH, MS, OTR/L ?ascom [phone] ?05/17/21, 5:20 PM ? ?

## 2021-05-17 NOTE — TOC Progression Note (Signed)
Transition of Care (TOC) - Progression Note  ? ? ?Patient Details  ?Name: Alicia Lambert ?MRN: [id number] ?Date of Birth: [birth year] ? ?Transition of Care (TOC) CM/SW Contact  ?Conception Oms, RN ?Phone Number: ?05/17/2021, 2:05 PM ? ?Clinical Narrative:    ? ?Alled son Alicia Lambert at [phone] and left a general VM for a call back ? ?Expected Discharge Plan: [place] ?Barriers to Discharge: Continued Medical Work up, SNF Pending bed offer, Insurance Authorization ? ?Expected Discharge Plan and Services ?Expected Discharge Plan: [place] ?  ?Discharge Planning Services: CM Consult ?  ?Living arrangements for the past 2 months: [place] ?                ?DME Arranged: N/A ?  ?  ?  ?  ?  ?[place] Agency: NA ?  ?  ?  ? ? ?Social Determinants of Health (SDOH) Interventions ?  ? ?Readmission Risk Interventions ?No flowsheet data found. ? ?

## 2021-05-17 NOTE — Progress Notes (Signed)
? ?  Subjective: ?4 Days Post-Op Procedure(s) (LRB): ?TOTAL KNEE ARTHROPLASTY (Right) ?Patient reports pain as mild.   ?Patient is well, and has had no acute complaints or problems ?Denies any CP, SOB, ABD pain. ?We will continue therapy today.  ?Plan is to go Skilled nursing facility after hospital stay. ? ?Objective: ?Vital signs in last 24 hours: ?Temp:  [97.8 ?F (36.6 ?C)-99 ?F (37.2 ?C)] 98.5 ?F (36.9 ?C) (03/20 4196) ?Pulse Rate:  [64-72] 68 (03/20 0538) ?Resp:  [16-18] 16 (03/20 0538) ?BP: (113-154)/(57-62) 154/57 (03/20 0538) ?SpO2:  [95 %-99 %] 95 % (03/20 0538) ? ?Intake/Output from previous day: ?03/19 0701 - 03/20 0700 ?In: -  ?Out: 530 [Urine:530] ?Intake/Output this shift: ?Total I/O ?In: -  ?Out: 400 [Urine:400] ? ?Recent Labs  ?  05/15/21 ?0446  ?HGB 8.1*  ? ?Recent Labs  ?  05/15/21 ?0446  ?WBC 10.2  ?RBC 2.62*  ?HCT 25.7*  ?PLT 150  ? ?No results for input(s): NA, K, CL, CO2, BUN, CREATININE, GLUCOSE, CALCIUM in the last 72 hours. ?No results for input(s): LABPT, INR in the last 72 hours. ? ?EXAM ?General - Patient is Alert, Appropriate, and Oriented ?Extremity - Neurovascular intact ?Sensation intact distally ?Intact pulses distally ?Dorsiflexion/Plantar flexion intact ?Dressing - dressing C/D/I and no drainage ?Motor Function - intact, moving foot and toes well on exam.  ? ?Past Medical History:  ?Diagnosis Date  ? Anginal pain ([place])   ? Aortic atherosclerosis ([place])   ? B12 deficiency   ? Basal cell carcinoma (BCC) of skin of nose   ? Bilateral cataracts   ? a.) s/p extraction  ? Bilateral occipital neuralgia   ? Chondrocalcinosis   ? Coronary artery disease 07/21/2016  ? a.) LHC 07/21/2016: EF 55-65%; LVEDP norm; 25% pLCx; no intervention.  ? Diastolic dysfunction 22/29/7989  ? a.) TTE 03/17/2017: EF 45%; mild LVH, mod LA enlargement, global HK, G1DD. b.) TTE 12/12/2017: EF 45%; global HK, mild LVH adn LA enlargement; triv PR, mild AR/MR, mod TR; G1DD. c.) TTE 05/13/2019: EF 45%; mild LVH, mild  LA enlargement; triv PR, mild MR/TR, mod AR; G1DD.  ? GERD (gastroesophageal reflux disease)   ? Headache disorder   ? HLD (hyperlipidemia)   ? Hypertension   ? LBBB (left bundle branch block)   ? Memory difficulty   ? Osteoarthritis   ? Palpitations   ? Pulmonary HTN ([place]) 03/17/2017  ? a.) TTE 03/17/2017: EF 45%; RVSP 36.9 mmHg. b.) TTE 12/15/2017: EF 45%; RVSP 40.3 mmHg. c.) TTE 05/13/2019; EF 45%; EF 45%; RVSP 42.0 mmHg  ? PVD (peripheral vascular disease) ([place])   ? Rheumatoid arthritis ([place])   ? ? ?Assessment/Plan:   ?4 Days Post-Op Procedure(s) (LRB): ?TOTAL KNEE ARTHROPLASTY (Right) ?Principal Problem: ?  S/P TKR (total knee replacement) using cement, right ? ?Estimated body mass index is 25.75 kg/m? as calculated from the following: ?  Height as of this encounter: '5\' 4"'$  (1.626 m). ?  Weight as of this encounter: 68 kg. ?Advance diet ?Up with therapy ?Work on bowel movement ?Vital signs are stable ?Acute postop blood loss anemia -hemoglobin 8.1.  Recheck hemoglobin this morning and continue with iron supplement. ?Patient overall doing well, care management to assist with discharge to skilled nursing facility today ? ?DVT Prophylaxis - Lovenox, TED hose, and SCDs ?Weight-Bearing as tolerated to right leg ? ? ?T. Rachelle Hora, PA-C ?[place] ?05/17/2021, 8:14 AM ?  ?

## 2021-05-17 NOTE — Plan of Care (Signed)

## 2021-05-17 NOTE — TOC Progression Note (Signed)
Transition of Care (TOC) - Progression Note  ? ? ?Patient Details  ?Name: Alicia Lambert ?MRN: [id number] ?Date of Birth: 27-Aug-1927 ? ?Transition of Care (TOC) CM/SW Contact  ?Conception Oms, RN ?Phone Number: ?05/17/2021, 2:48 PM ? ?Clinical Narrative:   Spoke with the patient's son [name] on the phone, he requested a deeper search for a str snf bed, I sent the Search to all facilities in [place] ? ? ? ?Expected Discharge Plan: [place] ?Barriers to Discharge: Continued Medical Work up, SNF Pending bed offer, Insurance Authorization ? ?Expected Discharge Plan and Services ?Expected Discharge Plan: [place] ?  ?Discharge Planning Services: CM Consult ?  ?Living arrangements for the past 2 months: [place] ?                ?DME Arranged: N/A ?  ?  ?  ?  ?  ?[place] Agency: NA ?  ?  ?  ? ? ?Social Determinants of Health (SDOH) Interventions ?  ? ?Readmission Risk Interventions ?No flowsheet data found. ? ?

## 2021-05-17 NOTE — Progress Notes (Addendum)
Physical Therapy Treatment ?Patient Details ?Name: Alicia Lambert ?MRN: [id number] ?DOB: 11-Aug-1927 ?Today's Date: 05/17/2021 ? ? ?History of Present Illness Pt is a 88 y.o. female s/p R TKA 05/13/21 secondary primary localized OA.  PMH includes CAD, LBBB, PVD, aortic atherosclerosis, angina, diastolic dysfunction, pulmonary hypertension, palpitations, HTN, HLD, GERD, OA, RA, memory difficulties, and back surgery. ? ?  ?PT Comments  ? ? Participated in exercises as described below. ?  To EOB with slow movements and support of RLE resistant to bend.  Time given in sitting and she is able to stand with min./mod a x 1 and transfer slowly to chair with cues for moving feet and assist to move walker.  Remained in chair after session,. ?  ?Recommendations for follow up therapy are one component of a multi-disciplinary discharge planning process, led by the attending physician.  Recommendations may be updated based on patient status, additional functional criteria and insurance authorization. ? ?Follow Up Recommendations ? Skilled nursing-short term rehab (<3 hours/day) ?  ?  ?Assistance Recommended at Discharge Frequent or constant Supervision/Assistance  ?Patient can return home with the following A lot of help with walking and/or transfers;A lot of help with bathing/dressing/bathroom;Assistance with cooking/housework;Assist for transportation;Help with stairs or ramp for entrance;Direct supervision/assist for medications management ?  ?Equipment Recommendations ? Rolling walker (2 wheels);BSC/3in1  ?  ?Recommendations for Other Services   ? ? ?  ?Precautions / Restrictions Precautions ?Precautions: Fall ?Precaution Comments: wound vac ?Restrictions ?Weight Bearing Restrictions: Yes ?RLE Weight Bearing: Weight bearing as tolerated  ?  ? ?Mobility ? Bed Mobility ?Overal bed mobility: Needs Assistance ?Bed Mobility: Supine to Sit ?  ?  ?Supine to sit: Mod assist ?  ?  ?  ?  ? ?Transfers ?Overall transfer level: Needs  assistance ?Equipment used: Rolling walker (2 wheels) ?Transfers: Sit to/from Stand ?Sit to Stand: Min assist ?  ?  ?  ?  ?  ?  ?  ? ?Ambulation/Gait ?Ambulation/Gait assistance: Mod assist ?Gait Distance (Feet): 3 Feet ?Assistive device: Rolling walker (2 wheels) ?Gait Pattern/deviations: Shuffle, Antalgic, Step-to pattern ?Gait velocity: decreased ?  ?  ?  ? ? ?Stairs ?  ?  ?  ?  ?  ? ? ?Wheelchair Mobility ?  ? ?Modified Rankin (Stroke Patients Only) ?  ? ? ?  ?Balance Overall balance assessment: Needs assistance ?Sitting-balance support: No upper extremity supported, Feet supported ?Sitting balance-Leahy Scale: Good ?  ?  ?Standing balance support: Bilateral upper extremity supported, During functional activity, Reliant on assistive device for balance ?Standing balance-Leahy Scale: Fair ?Standing balance comment: completely reliant on RW for all standing activity ?  ?  ?  ?  ?  ?  ?  ?  ?  ?  ?  ?  ? ?  ?Cognition Arousal/Alertness: Awake/alert ?Behavior During Therapy: [facility] for tasks assessed/performed ?Overall Cognitive Status: Within Functional Limits for tasks assessed ?  ?  ?  ?  ?  ?  ?  ?  ?  ?  ?  ?  ?  ?  ?  ?  ?  ?  ?  ? ?  ?Exercises   ? ?  ?General Comments   ?  ?  ? ?Pertinent Vitals/Pain Pain Assessment ?Pain Assessment: Faces ?Faces Pain Scale: Hurts little more ?Pain Location: R knee ?Pain Descriptors / Indicators: Aching, Sore, Tender ?Pain Intervention(s): Limited activity within patient's tolerance, Monitored during session, Premedicated before session, Repositioned  ? ? ?Home Living   ?  ?  ?  ?  ?  ?  ?  ?  ?  ?   ?  ?  Prior Function    ?  ?  ?   ? ?PT Goals (current goals can now be found in the care plan section) Progress towards PT goals: Progressing toward goals ? ?  ?Frequency ? ? ? BID ? ? ? ?  ?PT Plan Current plan remains appropriate  ? ? ?Co-evaluation   ?  ?  ?  ?  ? ?  ?AM-PAC PT "6 Clicks" Mobility   ?Outcome Measure ? Help needed turning from your back to your side while in a flat  bed without using bedrails?: None ?Help needed moving from lying on your back to sitting on the side of a flat bed without using bedrails?: A Lot ?Help needed moving to and from a bed to a chair (including a wheelchair)?: A Lot ?Help needed standing up from a chair using your arms (e.g., wheelchair or bedside chair)?: A Lot ?Help needed to walk in hospital room?: A Lot ?Help needed climbing 3-5 steps with a railing? : Total ?6 Click Score: 13 ? ?  ?End of Session Equipment Utilized During Treatment: Gait belt ?Activity Tolerance: Patient tolerated treatment well;Patient limited by fatigue ?Patient left: in bed;with call bell/phone within reach;with bed alarm set ?Nurse Communication: Mobility status;Precautions;Weight bearing status;Other (comment) ?PT Visit Diagnosis: Unsteadiness on feet (R26.81);Other abnormalities of gait and mobility (R26.89);Muscle weakness (generalized) (M62.81);Pain ?Pain - Right/Left: Right ?Pain - part of body: Knee ?  ? ? ?Time: 1030-1055 ?PT Time Calculation (min) (ACUTE ONLY): 25 min ? ?Charges:  $Therapeutic Exercise: 8-22 mins ?$Therapeutic Activity: 8-22 mins          ?          ?Chesley Noon, PTA ?05/17/21, 8:25 AM ? ?

## 2021-05-17 NOTE — Progress Notes (Signed)
Physical Therapy Treatment ?Patient Details ?Name: Alicia Lambert ?MRN: [id number] ?DOB: 1927/05/07 ?Today's Date: 05/17/2021 ? ? ?History of Present Illness Pt is a 88 y.o. female s/p R TKA 05/13/21 secondary primary localized OA.  PMH includes CAD, LBBB, PVD, aortic atherosclerosis, angina, diastolic dysfunction, pulmonary hypertension, palpitations, HTN, HLD, GERD, OA, RA, memory difficulties, and back surgery. ? ?  ?PT Comments  ? ? Pt in bed.  Premedicated.  Participated in exercises as described below.  To EOB with mod a x 1 and heavy verbal cues for hand placements and encouragement to get to EOB.  Steady in sitting.  Self limits knee flexion RLE with ROM and gentle stretching.  Educated on importance of ROM.  Stood and transferred to commode at bedside with min a x 1. Extended time on commode for attempt at [facility].  Stated at home she uses a glove to manually move stool.  No results on commode despite suppository and time.  She is able to stand and pivot 180 degrees to recliner with heavy cues and assist to move R foot to prevent twisting of R knee.  She does not take any true steps but will shimmy feet along floor.  She has heavy forward lean on walker during mobility.   Remained in recliner after session.  Sats 95% on room air after mobility. ?  ?Recommendations for follow up therapy are one component of a multi-disciplinary discharge planning process, led by the attending physician.  Recommendations may be updated based on patient status, additional functional criteria and insurance authorization. ? ?Follow Up Recommendations ? Skilled nursing-short term rehab (<3 hours/day) ?  ?  ?Assistance Recommended at Discharge Frequent or constant Supervision/Assistance  ?Patient can return home with the following A lot of help with walking and/or transfers;A lot of help with bathing/dressing/bathroom;Assistance with cooking/housework;Assist for transportation;Help with stairs or ramp for entrance;Direct  supervision/assist for medications management ?  ?Equipment Recommendations ? Rolling walker (2 wheels);BSC/3in1  ?  ?Recommendations for Other Services   ? ? ?  ?Precautions / Restrictions Precautions ?Precautions: Fall ?Precaution Comments: wound vac ?Restrictions ?Weight Bearing Restrictions: Yes ?RLE Weight Bearing: Weight bearing as tolerated  ?  ? ?Mobility ? Bed Mobility ?Overal bed mobility: Needs Assistance ?Bed Mobility: Supine to Sit ?  ?  ?Supine to sit: Mod assist ?  ?  ?General bed mobility comments: Pt required assistance to progress BLEs into bed from EOB sitting. ?  ? ?Transfers ?Overall transfer level: Needs assistance ?Equipment used: Rolling walker (2 wheels) ?Transfers: Sit to/from Stand ?Sit to Stand: Min assist ?  ?  ?  ?  ?  ?  ?  ? ?Ambulation/Gait ?Ambulation/Gait assistance: Mod assist ?Gait Distance (Feet): 3 Feet ?Assistive device: Rolling walker (2 wheels) ?Gait Pattern/deviations: Shuffle, Antalgic, Step-to pattern ?Gait velocity: decreased ?  ?  ?General Gait Details: no true gait.  pivots/ slides feet on floor with assist to reposition r foot and prevent twisiting. ? ? ?Stairs ?  ?  ?  ?  ?  ? ? ?Wheelchair Mobility ?  ? ?Modified Rankin (Stroke Patients Only) ?  ? ? ?  ?Balance Overall balance assessment: Needs assistance ?Sitting-balance support: No upper extremity supported, Feet supported ?Sitting balance-Leahy Scale: Good ?  ?  ?Standing balance support: Bilateral upper extremity supported, During functional activity, Reliant on assistive device for balance ?Standing balance-Leahy Scale: Fair ?Standing balance comment: completely reliant on RW for all standing activity ?  ?  ?  ?  ?  ?  ?  ?  ?  ?  ?  ?  ? ?  ?  Cognition Arousal/Alertness: Awake/alert ?Behavior During Therapy: [facility] for tasks assessed/performed ?Overall Cognitive Status: Within Functional Limits for tasks assessed ?  ?  ?  ?  ?  ?  ?  ?  ?  ?  ?  ?  ?  ?  ?  ?  ?  ?  ?  ? ?  ?Exercises Total Joint Exercises ?Ankle  Circles/Pumps: AROM, Strengthening, Both, 10 reps, Supine ?Quad Sets: AROM, Strengthening, Both, 10 reps, Supine ?Short Arc Quad: AAROM, Strengthening, Right, 10 reps, Supine ?Heel Slides: AAROM, Strengthening, Right, 10 reps, Supine ?Hip ABduction/ADduction: AAROM, Strengthening, Right, 10 reps, Supine ?Straight Leg Raises: AAROM, Strengthening, Right, 10 reps, Supine ?Goniometric ROM: 4-80 - self limits due to pain ? ?  ?General Comments   ?  ?  ? ?Pertinent Vitals/Pain Pain Assessment ?Pain Assessment: Faces ?Faces Pain Scale: Hurts even more ?Pain Location: R knee ?Pain Descriptors / Indicators: Aching, Sore, Tender ?Pain Intervention(s): Limited activity within patient's tolerance, Monitored during session, Premedicated before session, Repositioned, Ice applied  ? ? ?Home Living   ?  ?  ?  ?  ?  ?  ?  ?  ?  ?   ?  ?Prior Function    ?  ?  ?   ? ?PT Goals (current goals can now be found in the care plan section) Progress towards PT goals: Progressing toward goals ? ?  ?Frequency ? ? ? BID ? ? ? ?  ?PT Plan Current plan remains appropriate  ? ? ?Co-evaluation   ?  ?  ?  ?  ? ?  ?AM-PAC PT "6 Clicks" Mobility   ?Outcome Measure ? Help needed turning from your back to your side while in a flat bed without using bedrails?: None ?Help needed moving from lying on your back to sitting on the side of a flat bed without using bedrails?: A Lot ?Help needed moving to and from a bed to a chair (including a wheelchair)?: A Lot ?Help needed standing up from a chair using your arms (e.g., wheelchair or bedside chair)?: A Little ?Help needed to walk in hospital room?: A Lot ?Help needed climbing 3-5 steps with a railing? : Total ?6 Click Score: 14 ? ?  ?End of Session Equipment Utilized During Treatment: Gait belt ?Activity Tolerance: Patient tolerated treatment well;Patient limited by fatigue ?Patient left: in bed;with call bell/phone within reach;with bed alarm set ?Nurse Communication: Mobility status;Precautions;Weight  bearing status;Other (comment) ?PT Visit Diagnosis: Unsteadiness on feet (R26.81);Other abnormalities of gait and mobility (R26.89);Muscle weakness (generalized) (M62.81);Pain ?Pain - Right/Left: Right ?Pain - part of body: Knee ?  ? ? ?Time: 0051-1021 ?PT Time Calculation (min) (ACUTE ONLY): 38 min ? ?Charges:  $Therapeutic Exercise: 8-22 mins ?$Therapeutic Activity: 23-37 mins          ?         Chesley Noon, PTA ?05/17/21, 11:52 AM ? ?

## 2021-05-17 NOTE — Progress Notes (Signed)
Physical Therapy Treatment ?Patient Details ?Name: Alicia Lambert ?MRN: [id number] ?DOB: Jul 19, 1927 ?Today's Date: 05/17/2021 ? ? ?History of Present Illness Pt is a 88 y.o. female s/p R TKA 05/13/21 secondary primary localized OA.  PMH includes CAD, LBBB, PVD, aortic atherosclerosis, angina, diastolic dysfunction, pulmonary hypertension, palpitations, HTN, HLD, GERD, OA, RA, memory difficulties, and back surgery. ? ?  ?PT Comments  ? ? Pt in chair, needing to void.  Stood with min a x 1 and increased time.  She is able to sidestep to left to commode then to bed.  She does void and have a small formed BM.  Tech aware as she has not had a BM to date.  Mod a x 1 to get LE's up onto bed.  Positioned to comfort. ? ?Pt is making slow progress towards goals as is yet to take any true steps.  Improving moving to left side but still struggles to right.  Care to prevent twisting of knee is taken when moving to right.  SNF remains appropriate for discharge. ?  ?Recommendations for follow up therapy are one component of a multi-disciplinary discharge planning process, led by the attending physician.  Recommendations may be updated based on patient status, additional functional criteria and insurance authorization. ? ?Follow Up Recommendations ? Skilled nursing-short term rehab (<3 hours/day) ?  ?  ?Assistance Recommended at Discharge Frequent or constant Supervision/Assistance  ?Patient can return home with the following A lot of help with walking and/or transfers;A lot of help with bathing/dressing/bathroom;Assistance with cooking/housework;Assist for transportation;Help with stairs or ramp for entrance;Direct supervision/assist for medications management ?  ?Equipment Recommendations ? Rolling walker (2 wheels);BSC/3in1  ?  ?Recommendations for Other Services   ? ? ?  ?Precautions / Restrictions Precautions ?Precautions: Fall ?Precaution Comments: wound vac ?Restrictions ?Weight Bearing Restrictions: Yes ?RLE Weight Bearing:  Weight bearing as tolerated  ?  ? ?Mobility ? Bed Mobility ?Overal bed mobility: Needs Assistance ?Bed Mobility: Sit to Supine ?  ?  ?Supine to sit: Mod assist ?Sit to supine: Mod assist ?  ?General bed mobility comments: Pt required assistance to progress BLEs into bed from EOB sitting. ?  ? ?Transfers ?Overall transfer level: Needs assistance ?Equipment used: Rolling walker (2 wheels) ?Transfers: Sit to/from Stand ?Sit to Stand: Min assist ?  ?  ?  ?  ?  ?General transfer comment: increased time and cues but overall less physical assist ?  ? ?Ambulation/Gait ?Ambulation/Gait assistance: Min assist ?Gait Distance (Feet): 3 Feet ?Assistive device: Rolling walker (2 wheels) ?Gait Pattern/deviations: Shuffle, Antalgic, Step-to pattern ?Gait velocity: decreased ?  ?  ?General Gait Details: sidesteps to left chair -> commode-> bed.  mostly slides feet and not a true step ? ? ?Stairs ?  ?  ?  ?  ?  ? ? ?Wheelchair Mobility ?  ? ?Modified Rankin (Stroke Patients Only) ?  ? ? ?  ?Balance Overall balance assessment: Needs assistance ?Sitting-balance support: No upper extremity supported, Feet supported ?Sitting balance-Leahy Scale: Good ?  ?  ?Standing balance support: Bilateral upper extremity supported, During functional activity, Reliant on assistive device for balance ?Standing balance-Leahy Scale: Fair ?Standing balance comment: completely reliant on RW for all standing activity ?  ?  ?  ?  ?  ?  ?  ?  ?  ?  ?  ?  ? ?  ?Cognition Arousal/Alertness: Awake/alert ?Behavior During Therapy: [facility] for tasks assessed/performed ?Overall Cognitive Status: Within Functional Limits for tasks assessed ?  ?  ?  ?  ?  ?  ?  ?  ?  ?  ?  ?  ?  ?  ?  ?  ?  ?  ?  ? ?  ?  Exercises Total Joint Exercises ?Ankle Circles/Pumps: AROM, Strengthening, Both, 10 reps, Supine ?Quad Sets: AROM, Strengthening, Both, 10 reps, Supine ?Short Arc Quad: AAROM, Strengthening, Right, 10 reps, Supine ?Heel Slides: AAROM, Strengthening, Right, 10 reps,  Supine ?Hip ABduction/ADduction: AAROM, Strengthening, Right, 10 reps, Supine ?Straight Leg Raises: AAROM, Strengthening, Right, 10 reps, Supine ?Goniometric ROM: 4-80 - self limits due to pain ? ?  ?General Comments   ?  ?  ? ?Pertinent Vitals/Pain Pain Assessment ?Pain Assessment: Faces ?Faces Pain Scale: Hurts little more ?Pain Location: R knee ?Pain Descriptors / Indicators: Aching, Sore, Tender ?Pain Intervention(s): Limited activity within patient's tolerance, Monitored during session, Repositioned, Ice applied  ? ? ?Home Living   ?  ?  ?  ?  ?  ?  ?  ?  ?  ?   ?  ?Prior Function    ?  ?  ?   ? ?PT Goals (current goals can now be found in the care plan section) Progress towards PT goals: Progressing toward goals ? ?  ?Frequency ? ? ? BID ? ? ? ?  ?PT Plan Current plan remains appropriate  ? ? ?Co-evaluation   ?  ?  ?  ?  ? ?  ?AM-PAC PT "6 Clicks" Mobility   ?Outcome Measure ? Help needed turning from your back to your side while in a flat bed without using bedrails?: None ?Help needed moving from lying on your back to sitting on the side of a flat bed without using bedrails?: A Lot ?Help needed moving to and from a bed to a chair (including a wheelchair)?: A Lot ?Help needed standing up from a chair using your arms (e.g., wheelchair or bedside chair)?: A Little ?Help needed to walk in hospital room?: A Lot ?Help needed climbing 3-5 steps with a railing? : Total ?6 Click Score: 14 ? ?  ?End of Session Equipment Utilized During Treatment: Gait belt ?Activity Tolerance: Patient tolerated treatment well;Patient limited by fatigue ?Patient left: in bed;with call bell/phone within reach;with bed alarm set ?Nurse Communication: Mobility status;Precautions;Weight bearing status;Other (comment) ?PT Visit Diagnosis: Unsteadiness on feet (R26.81);Other abnormalities of gait and mobility (R26.89);Muscle weakness (generalized) (M62.81);Pain ?Pain - Right/Left: Right ?Pain - part of body: Knee ?  ? ? ?Time: 0204-0228 ?PT  Time Calculation (min) (ACUTE ONLY): 24 min ? ?Charges:  $Gait Training: 23-37 mins ?$Therapeutic Exercise: 8-22 mins ?$Therapeutic Activity: 23-37 mins          ?         Chesley Noon, PTA ?05/17/21, 3:30 PM ? ?

## 2021-05-18 NOTE — Progress Notes (Signed)
Physical Therapy Treatment ?Patient Details ?Name: Alicia Lambert ?MRN: [id number] ?DOB: 1927/09/10 ?Today's Date: 05/18/2021 ? ? ?History of Present Illness Pt is a 88 y.o. female s/p R TKA 05/13/21 secondary primary localized OA.  PMH includes CAD, LBBB, PVD, aortic atherosclerosis, angina, diastolic dysfunction, pulmonary hypertension, palpitations, HTN, HLD, GERD, OA, RA, memory difficulties, and back surgery. ? ?  ?PT Comments  ? ? Pt resting in bed upon PT arrival (nursing reporting recently assisting pt back to bed).  Pt reporting being too worn out to participate in therapy but eventually agreeable to in bed LE ex's with encouragement (pt declined any OOB mobility d/t being too tired).  Tolerated LE ex's in bed fairly well with assist for R LE as needed.  Will continue to focus on strengthening, knee ROM, and progressive functional mobility per pt tolerance. ?  ?Recommendations for follow up therapy are one component of a multi-disciplinary discharge planning process, led by the attending physician.  Recommendations may be updated based on patient status, additional functional criteria and insurance authorization. ? ?Follow Up Recommendations ? Skilled nursing-short term rehab (<3 hours/day) ?  ?  ?Assistance Recommended at Discharge Frequent or constant Supervision/Assistance  ?Patient can return home with the following A lot of help with walking and/or transfers;A lot of help with bathing/dressing/bathroom;Assistance with cooking/housework;Assist for transportation;Help with stairs or ramp for entrance;Direct supervision/assist for medications management ?  ?Equipment Recommendations ? Rolling walker (2 wheels);BSC/3in1  ?  ?Recommendations for Other Services OT consult ? ? ?  ?Precautions / Restrictions Precautions ?Precautions: Fall ?Precaution Comments: wound vac ?Restrictions ?Weight Bearing Restrictions: Yes ?RLE Weight Bearing: Weight bearing as tolerated  ?  ? ?Mobility ? Bed Mobility ?Overal bed  mobility:  (pt declined OOB mobility d/t feeling worn out) ?  ?  ?  ?  ?  ?  ?  ?  ? ?Transfers ?  ?  ?  ?  ?  ? ?Ambulation/Gait ?  ?  ?  ? ? ?Stairs ?  ?  ?  ?  ?  ? ? ?Wheelchair Mobility ?  ? ?Modified Rankin (Stroke Patients Only) ?  ? ? ?  ?Balance   ?  ?  ?  ?  ?  ?  ?  ?  ?  ?  ?  ?  ? ?  ?Cognition Arousal/Alertness: Awake/alert ?Behavior During Therapy: [facility] for tasks assessed/performed ?Overall Cognitive Status: Within Functional Limits for tasks assessed ?  ?  ?  ?  ?  ?  ?  ?  ?  ?  ?  ?  ?  ?  ?  ?  ?  ?  ?  ? ?  ?Exercises Total Joint Exercises ?Ankle Circles/Pumps: AROM, Strengthening, Both, Supine (10 reps x2 B LE's) ?Quad Sets: AROM, Strengthening, Both, Supine (10 reps x2 B LE's) ?Short Arc Quad: AROM, Left, AAROM, Right, Strengthening, Supine (10 reps x2 B LE's) ?Heel Slides: AROM, Left, AAROM, Right, Strengthening, Supine (10 reps x2 B LE's) ?Hip ABduction/ADduction: AROM, Left, AAROM, Right, Strengthening, Supine (10 reps x2 B LE's) ?Straight Leg Raises: AROM, Left, AAROM, Right, Strengthening, Supine (10 reps x2 B LE's) ?Goniometric ROM: 5-70 degrees (limited d/t R knee pain) ? ?  ?General Comments  Nursing cleared pt for participation in physical therapy. ?  ?  ? ?Pertinent Vitals/Pain Pain Assessment ?Pain Assessment: Faces ?Faces Pain Scale: Hurts little more ?Pain Location: R knee ?Pain Descriptors / Indicators: Aching, Sore, Tender ?Pain Intervention(s): Limited activity within patient's tolerance, Monitored  during session, Premedicated before session, Repositioned, Other (comment) (polar care applied/activated) ?Vitals (HR and O2 on room air) stable and WFL throughout treatment session.  ? ? ?Home Living   ?  ?  ?  ?  ?  ?  ?  ?  ?  ?   ?  ?Prior Function    ?  ?  ?   ? ?PT Goals (current goals can now be found in the care plan section) Acute Rehab PT Goals ?Patient Stated Goal: none stated ?PT Goal Formulation: With patient ?Time For Goal Achievement: 05/27/21 ?Potential to Achieve  Goals: Fair ?Progress towards PT goals: Progressing toward goals (with LE strengthening) ? ?  ?Frequency ? ? ? BID ? ? ? ?  ?PT Plan Current plan remains appropriate  ? ? ?Co-evaluation   ?  ?  ?  ?  ? ?  ?AM-PAC PT "6 Clicks" Mobility   ?Outcome Measure ? Help needed turning from your back to your side while in a flat bed without using bedrails?: None ?Help needed moving from lying on your back to sitting on the side of a flat bed without using bedrails?: A Lot ?Help needed moving to and from a bed to a chair (including a wheelchair)?: A Little ?Help needed standing up from a chair using your arms (e.g., wheelchair or bedside chair)?: A Little ?Help needed to walk in hospital room?: A Lot ?Help needed climbing 3-5 steps with a railing? : Total ?6 Click Score: 15 ? ?  ?End of Session Equipment Utilized During Treatment: Gait belt ?Activity Tolerance: Patient limited by fatigue ?Patient left: in bed;with call bell/phone within reach;with bed alarm set;with SCD's reapplied;Other (comment) (B heels floating via towel rolls (pt declined bone foam); polar care in place) ?Nurse Communication: Mobility status;Precautions;Weight bearing status ?PT Visit Diagnosis: Unsteadiness on feet (R26.81);Other abnormalities of gait and mobility (R26.89);Muscle weakness (generalized) (M62.81);Pain ?Pain - Right/Left: Right ?Pain - part of body: Knee ?  ? ? ?Time: 6553-7482 ?PT Time Calculation (min) (ACUTE ONLY): 23 min ? ?Charges:  $Therapeutic Exercise: 23-37 mins         ?          ?Leitha Bleak, PT ?05/18/21, 3:51 PM ? ? ?

## 2021-05-18 NOTE — Progress Notes (Signed)
Occupational Therapy Treatment ?Patient Details ?Name: Alicia Lambert ?MRN: [id number] ?DOB: 04-Mar-1927 ?Today's Date: 05/18/2021 ? ? ?History of present illness Pt is a 88 y.o. female s/p R TKA 05/13/21 secondary primary localized OA.  PMH includes CAD, LBBB, PVD, aortic atherosclerosis, angina, diastolic dysfunction, pulmonary hypertension, palpitations, HTN, HLD, GERD, OA, RA, memory difficulties, and back surgery. ?  ?OT comments ? Pt seen for OT tx. Pt in bed, blue bone foam malaligned and R knee somewhat flexed. Pt agreeable to EOB with encouragement, endorsing 7/10 R knee pain. Pt completed bed mobility with MOD A and additional time to allow R knee to bend to the floor. Had to elevate the bed to complete STS with MIN A. Able to shuffle over to the [facility] with SPT + time/effort. Pt endorsed mild nausea upon standing and then sitting, but denied need for assist to address it. Pt educated in R knee positioning in long sitting, supine, and in sitting EOB to support joint alignment and comfort while optimizing recovery; emotional support and active listening provided as pt shares how difficult this has been and shares about her past briefly. Pt progressing slowly but surely, continues to benefit from skilled OT.   ? ?Recommendations for follow up therapy are one component of a multi-disciplinary discharge planning process, led by the attending physician.  Recommendations may be updated based on patient status, additional functional criteria and insurance authorization. ?   ?Follow Up Recommendations ? Skilled nursing-short term rehab (<3 hours/day)  ?  ?Assistance Recommended at Discharge Frequent or constant Supervision/Assistance  ?Patient can return home with the following ? A lot of help with walking and/or transfers;A lot of help with bathing/dressing/bathroom;Assistance with cooking/housework;Assist for transportation;Help with stairs or ramp for entrance;Direct supervision/assist for financial management;Direct  supervision/assist for medications management ?  ?Equipment Recommendations ? BSC/3in1  ?  ?Recommendations for Other Services   ? ?  ?Precautions / Restrictions Precautions ?Precautions: Fall ?Precaution Comments: wound vac ?Restrictions ?Weight Bearing Restrictions: Yes ?RLE Weight Bearing: Weight bearing as tolerated  ? ? ?  ? ?Mobility Bed Mobility ?Overal bed mobility: Needs Assistance ?Bed Mobility: Supine to Sit ?  ?  ?Supine to sit: Mod assist ?  ?  ?  ?  ? ?Transfers ?Overall transfer level: Needs assistance ?Equipment used: Rolling walker (2 wheels) ?Transfers: Sit to/from Stand, Bed to chair/wheelchair/BSC ?Sit to Stand: Min assist, From elevated surface ?  ?  ?  ?  ?  ?General transfer comment: SPT from EOB to recliner with shuffling feet, heavy reliance on RW ?  ?  ?Balance Overall balance assessment: Needs assistance ?Sitting-balance support: No upper extremity supported, Feet supported ?Sitting balance-Leahy Scale: Good ?  ?  ?Standing balance support: Bilateral upper extremity supported, During functional activity, Reliant on assistive device for balance ?Standing balance-Leahy Scale: Fair ?  ?  ?  ?  ?  ?  ?  ?  ?  ?  ?  ?  ?   ? ?ADL either performed or assessed with clinical judgement  ? ?ADL   ?  ?  ?  ?  ?  ?  ?  ?  ?  ?  ?  ?  ?  ?  ?  ?  ?  ?  ?  ?  ?  ? ?Extremity/Trunk Assessment   ?  ?  ?  ?  ?  ? ?Vision   ?  ?  ?Perception   ?  ?Praxis   ?  ? ?Cognition  Arousal/Alertness: Awake/alert ?Behavior During Therapy: [facility] for tasks assessed/performed ?Overall Cognitive Status: Within Functional Limits for tasks assessed ?  ?  ?  ?  ?  ?  ?  ?  ?  ?  ?  ?  ?  ?  ?  ?  ?  ?  ?  ?   ?Exercises Other Exercises ?Other Exercises: Pt educated in R knee positioning in long sitting, supine, and in sitting EOB to support joint alignment and comfort while optimizing recovery; emotional support and active listening provided as pt shares how difficult this has been and shares about her past briefly. ? ?   ?Shoulder Instructions   ? ? ?  ?General Comments    ? ? ?Pertinent Vitals/ Pain       Pain Assessment ?Pain Assessment: 0-10 ?Pain Score: 7  ?Pain Descriptors / Indicators: Aching, Sore, Tender ?Pain Intervention(s): Limited activity within patient's tolerance, Monitored during session, Premedicated before session, Repositioned, Ice applied ? ?Home Living   ?  ?  ?  ?  ?  ?  ?  ?  ?  ?  ?  ?  ?  ?  ?  ?  ?  ?  ? ?  ?Prior Functioning/Environment    ?  ?  ?  ?   ? ?Frequency ? Min 2X/week  ? ? ? ? ?  ?Progress Toward Goals ? ?OT Goals(current goals can now be found in the care plan section) ? Progress towards OT goals: Progressing toward goals ? ?Acute Rehab OT Goals ?Patient Stated Goal: feel better ?OT Goal Formulation: With patient ?Time For Goal Achievement: 05/28/21 ?Potential to Achieve Goals: Good  ?Plan Discharge plan remains appropriate;Frequency remains appropriate   ? ?Co-evaluation ? ? ?   ?  ?  ?  ?  ? ?  ?AM-PAC OT "6 Clicks" Daily Activity     ?Outcome Measure ? ? Help from another person eating meals?: None ?Help from another person taking care of personal grooming?: A Little ?Help from another person toileting, which includes using toliet, bedpan, or urinal?: A Lot ?Help from another person bathing (including washing, rinsing, drying)?: A Lot ?Help from another person to put on and taking off regular upper body clothing?: A Little ?Help from another person to put on and taking off regular lower body clothing?: A Lot ?6 Click Score: 16 ? ?  ?End of Session Equipment Utilized During Treatment: Gait belt;Rolling walker (2 wheels) ? ?OT Visit Diagnosis: Other abnormalities of gait and mobility (R26.89);Muscle weakness (generalized) (M62.81);Pain ?Pain - Right/Left: Right ?Pain - part of body: Knee ?  ?Activity Tolerance Patient tolerated treatment well ?  ?Patient Left in chair;with call bell/phone within reach;with chair alarm set;Other (comment) (polar care in place, wound vac plugged in, rolled  towel under ankle) ?  ?Nurse Communication   ?  ? ?   ? ?Time: 6256-3893 ?OT Time Calculation (min): 32 min ? ?Charges: OT General Charges ?$OT Visit: 1 Visit ?OT Treatments ?$Self Care/Home Management : 23-37 mins ? ?Ardeth Perfect., MPH, MS, OTR/L ?ascom [phone] ?05/18/21, 1:40 PM ?

## 2021-05-18 NOTE — Progress Notes (Signed)
? ?  Subjective: ?5 Days Post-Op Procedure(s) (LRB): ?TOTAL KNEE ARTHROPLASTY (Right) ?Patient reports pain as mild.   ?Patient is well, and has had no acute complaints or problems ?Denies any CP, SOB, ABD pain. ?We will continue therapy today.  ?Plan is to go Skilled nursing facility after hospital stay. ? ?Objective: ?Vital signs in last 24 hours: ?Temp:  [97.5 ?F (36.4 ?C)-98.5 ?F (36.9 ?C)] 98.5 ?F (36.9 ?C) (03/21 0805) ?Pulse Rate:  [71-76] 72 (03/21 0805) ?Resp:  [16-18] 18 (03/21 0805) ?BP: (138-156)/(53-61) 146/61 (03/21 0805) ?SpO2:  [94 %-96 %] 96 % (03/21 0805) ? ?Intake/Output from previous day: ?03/20 0701 - 03/21 0700 ?In: 600 [P.O.:600] ?Out: 600 [Urine:600] ?Intake/Output this shift: ?No intake/output data recorded. ? ?Recent Labs  ?  05/17/21 ?0806  ?HGB 8.0*  ? ?Recent Labs  ?  05/17/21 ?0806  ?WBC 7.6  ?RBC 2.52*  ?HCT 25.4*  ?PLT 182  ? ?Recent Labs  ?  05/17/21 ?0806  ?NA 135  ?K 3.9  ?CL 103  ?CO2 26  ?BUN 13  ?CREATININE 0.91  ?GLUCOSE 94  ?CALCIUM 8.2*  ? ?No results for input(s): LABPT, INR in the last 72 hours. ? ?EXAM ?General - Patient is Alert, Appropriate, and Oriented ?Extremity - Neurovascular intact ?Sensation intact distally ?Intact pulses distally ?Dorsiflexion/Plantar flexion intact ?Dressing - dressing C/D/I and no drainage ?Motor Function - intact, moving foot and toes well on exam.  ? ?Past Medical History:  ?Diagnosis Date  ? Anginal pain ([place])   ? Aortic atherosclerosis ([place])   ? B12 deficiency   ? Basal cell carcinoma (BCC) of skin of nose   ? Bilateral cataracts   ? a.) s/p extraction  ? Bilateral occipital neuralgia   ? Chondrocalcinosis   ? Coronary artery disease 07/21/2016  ? a.) LHC 07/21/2016: EF 55-65%; LVEDP norm; 25% pLCx; no intervention.  ? Diastolic dysfunction 03/50/0938  ? a.) TTE 03/17/2017: EF 45%; mild LVH, mod LA enlargement, global HK, G1DD. b.) TTE 12/12/2017: EF 45%; global HK, mild LVH adn LA enlargement; triv PR, mild AR/MR, mod TR; G1DD. c.) TTE  05/13/2019: EF 45%; mild LVH, mild LA enlargement; triv PR, mild MR/TR, mod AR; G1DD.  ? GERD (gastroesophageal reflux disease)   ? Headache disorder   ? HLD (hyperlipidemia)   ? Hypertension   ? LBBB (left bundle branch block)   ? Memory difficulty   ? Osteoarthritis   ? Palpitations   ? Pulmonary HTN ([place]) 03/17/2017  ? a.) TTE 03/17/2017: EF 45%; RVSP 36.9 mmHg. b.) TTE 12/15/2017: EF 45%; RVSP 40.3 mmHg. c.) TTE 05/13/2019; EF 45%; EF 45%; RVSP 42.0 mmHg  ? PVD (peripheral vascular disease) ([place])   ? Rheumatoid arthritis ([place])   ? ? ?Assessment/Plan:   ?5 Days Post-Op Procedure(s) (LRB): ?TOTAL KNEE ARTHROPLASTY (Right) ?Principal Problem: ?  S/P TKR (total knee replacement) using cement, right ? ?Estimated body mass index is 25.75 kg/m? as calculated from the following: ?  Height as of this encounter: '5\' 4"'$  (1.626 m). ?  Weight as of this encounter: 68 kg. ?Advance diet ?Up with therapy ?Vital signs are stable ?Acute postop blood loss anemia -hemoglobin 8.0.  Stable ?Patient overall doing well, care management to assist with discharge to skilled nursing facility. ? ?DVT Prophylaxis - Lovenox, TED hose, and SCDs ?Weight-Bearing as tolerated to right leg ? ? ?T. Rachelle Hora, PA-C ?[place] ?05/18/2021, 9:33 AM ?  ?

## 2021-05-18 NOTE — Progress Notes (Signed)
Physical Therapy Treatment ?Patient Details ?Name: Alicia Lambert ?MRN: [id number] ?DOB: [birth year] ?Today's Date: 05/18/2021 ? ? ?History of Present Illness Pt is a 88 y.o. female s/p R TKA 05/13/21 secondary primary localized OA.  PMH includes CAD, LBBB, PVD, aortic atherosclerosis, angina, diastolic dysfunction, pulmonary hypertension, palpitations, HTN, HLD, GERD, OA, RA, memory difficulties, and back surgery. ? ?  ?PT Comments  ? ? Pt resting in recliner upon PT arrival; pt agreeable to PT session but requesting to toilet first.  Min assist to stand from recliner and min assist to transfer/take steps recliner to [facility] with RW use (vc's required to take steps with R LE instead of attempting to pivot on R LE; pt initially pivoting on L LE but able to take a couple steps backwards with L LE towards BSC with max cueing).  Pt became nauseas sitting on BSC so pt's nurse notified and came to give pt nausea meds.  While nursing setting up to give pt meds, pt starting to appear pale and decreased responsiveness noted (pt still responding with cueing) so performed stand pivot BSC to recliner (closest surface pt able to lay down on) towards L side with 2 assist and pt reclined in recliner.  Nursing took pt's vitals (BP 105/52 with HR 53 bpm and O2 sats 92% on room air).  Improved symptoms noted with pt reclined in recliner and nurse cleared pt to stay in recliner at this time (call light, phone, and all needs in reach).  Will continue to focus on strengthening, knee ROM, and progressive functional mobility during hospitalization. ?  ?Recommendations for follow up therapy are one component of a multi-disciplinary discharge planning process, led by the attending physician.  Recommendations may be updated based on patient status, additional functional criteria and insurance authorization. ? ?Follow Up Recommendations ? Skilled nursing-short term rehab (<3 hours/day) ?  ?  ?Assistance Recommended at Discharge Frequent or constant  Supervision/Assistance  ?Patient can return home with the following A lot of help with walking and/or transfers;A lot of help with bathing/dressing/bathroom;Assistance with cooking/housework;Assist for transportation;Help with stairs or ramp for entrance;Direct supervision/assist for medications management ?  ?Equipment Recommendations ? Rolling walker (2 wheels);BSC/3in1  ?  ?Recommendations for Other Services OT consult ? ? ?  ?Precautions / Restrictions Precautions ?Precautions: Fall ?Precaution Comments: wound vac ?Restrictions ?Weight Bearing Restrictions: Yes ?RLE Weight Bearing: Weight bearing as tolerated  ?  ? ?Mobility ? Bed Mobility ?  ?  ?  ?  ?  ?  ?  ?  ?  ? ?Transfers ?Overall transfer level: Needs assistance ?Equipment used: Rolling walker (2 wheels) ?Transfers: Sit to/from Stand, Bed to chair/wheelchair/BSC ?Sit to Stand: Min assist ?Stand pivot transfers: +2 physical assistance ?  ?  ?  ?  ?General transfer comment: min assist to stand from recliner up to RW; 2 assist stand pivot BSC to recliner towards L side d/t pt appearing pale and concerns regarding pt going to pass out on BSC (pt with decreased responsiveness but still responding with cueing) ?  ? ?Ambulation/Gait ?Ambulation/Gait assistance: Min assist ?Gait Distance (Feet): 3 Feet (recliner to [facility]) ?Assistive device: Rolling walker (2 wheels) ?  ?Gait velocity: decreased ?  ?  ?General Gait Details: vc's to not pivot on R LE and to take step instead (pt able to take steps with R LE with extra time); pt initially pivoting on L LE but with  max cueing able to take a couple steps backwards towards BSC with L LE ? ? ?  Stairs ?  ?  ?  ?  ?  ? ? ?Wheelchair Mobility ?  ? ?Modified Rankin (Stroke Patients Only) ?  ? ? ?  ?Balance Overall balance assessment: Needs assistance ?Sitting-balance support: No upper extremity supported, Feet supported ?Sitting balance-Leahy Scale: Good ?Sitting balance - Comments: steady sitting reaching within BOS ?   ?Standing balance support: Bilateral upper extremity supported, During functional activity, Reliant on assistive device for balance ?Standing balance-Leahy Scale: Fair ?Standing balance comment: steady static standing with B UE support on RW ?  ?  ?  ?  ?  ?  ?  ?  ?  ?  ?  ?  ? ?  ?Cognition Arousal/Alertness: Awake/alert ?Behavior During Therapy: [facility] for tasks assessed/performed ?Overall Cognitive Status: Within Functional Limits for tasks assessed ?  ?  ?  ?  ?  ?  ?  ?  ?  ?  ?  ?  ?  ?  ?  ?  ?  ?  ?  ? ?  ?Exercises   ? ?  ?General Comments  Pt agreeable to PT session. ?  ?  ? ?Pertinent Vitals/Pain Pain Assessment ?Pain Assessment: Faces ?Faces Pain Scale: Hurts even more ?Pain Location: R knee ?Pain Descriptors / Indicators: Aching, Sore, Tender ?Pain Intervention(s): Limited activity within patient's tolerance, Monitored during session, Premedicated before session, Repositioned, Other (comment) (polar care applied)  ? ? ?Home Living   ?  ?  ?  ?  ?  ?  ?  ?  ?  ?   ?  ?Prior Function    ?  ?  ?   ? ?PT Goals (current goals can now be found in the care plan section) Acute Rehab PT Goals ?Patient Stated Goal: none stated ?PT Goal Formulation: With patient/family ?Time For Goal Achievement: 05/27/21 ?Potential to Achieve Goals: Good ?Progress towards PT goals: Progressing toward goals ? ?  ?Frequency ? ? ? BID ? ? ? ?  ?PT Plan Current plan remains appropriate  ? ? ?Co-evaluation   ?  ?  ?  ?  ? ?  ?AM-PAC PT "6 Clicks" Mobility   ?Outcome Measure ? Help needed turning from your back to your side while in a flat bed without using bedrails?: None ?Help needed moving from lying on your back to sitting on the side of a flat bed without using bedrails?: A Lot ?Help needed moving to and from a bed to a chair (including a wheelchair)?: A Little ?Help needed standing up from a chair using your arms (e.g., wheelchair or bedside chair)?: A Little ?Help needed to walk in hospital room?: A Lot ?Help needed climbing 3-5  steps with a railing? : Total ?6 Click Score: 15 ? ?  ?End of Session Equipment Utilized During Treatment: Gait belt ?Activity Tolerance: Treatment limited secondary to medical complications (Comment) (pt became nauseas and concern for pt passing out on [facility]) ?Patient left: in chair;with call bell/phone within reach;with chair alarm set;with SCD's reapplied;Other (comment) (R heel floating via towel roll) ?Nurse Communication: Mobility status;Precautions;Weight bearing status;Other (comment) (pt's nausea; nurse present when pt becoming pale and concerns for pt passing out--nurse assisted with pt transfer back to recliner and then nurse took pt's vitals) ?PT Visit Diagnosis: Unsteadiness on feet (R26.81);Other abnormalities of gait and mobility (R26.89);Muscle weakness (generalized) (M62.81);Pain ?Pain - Right/Left: Right ?Pain - part of body: Knee ?  ? ? ?Time: 9024-0973 ?PT Time Calculation (min) (ACUTE ONLY): 38 min ? ?Charges:  $Therapeutic  Activity: 38-52 mins          ?          ?Leitha Bleak, PT ?05/18/21, 1:19 PM ? ? ?

## 2021-05-18 NOTE — TOC Progression Note (Signed)
Transition of Care (TOC) - Progression Note  ? ? ?Patient Details  ?Name: Alicia Lambert ?MRN: [id number] ?Date of Birth: Nov 23, 1927 ? ?Transition of Care (TOC) CM/SW Contact  ?Conception Oms, RN ?Phone Number: ?05/18/2021, 10:08 AM ? ?Clinical Narrative:    ?Spoke with the son [name] reviewed all the bed offers, He chose [facility] ?I notified Hilda Blades at [facility] and requested Ins auth to be started ? ? ?Expected Discharge Plan: [place] ?Barriers to Discharge: Continued Medical Work up, SNF Pending bed offer, Insurance Authorization ? ?Expected Discharge Plan and Services ?Expected Discharge Plan: [place] ?  ?Discharge Planning Services: CM Consult ?  ?Living arrangements for the past 2 months: [place] ?                ?DME Arranged: N/A ?  ?  ?  ?  ?  ?[place] Agency: NA ?  ?  ?  ? ? ?Social Determinants of Health (SDOH) Interventions ?  ? ?Readmission Risk Interventions ?No flowsheet data found. ? ?

## 2021-05-19 LAB — RESP PANEL BY RT-PCR (FLU A&B, COVID) ARPGX2
Influenza A by PCR: NEGATIVE
Influenza B by PCR: NEGATIVE
SARS Coronavirus 2 by RT PCR: NEGATIVE

## 2021-05-19 NOTE — Plan of Care (Signed)
?  Problem: Education: ?Goal: Knowledge of General Education information will improve ?Description: Including pain rating scale, medication(s)/side effects and non-pharmacologic comfort measures ?Outcome: Progressing ?  ?Problem: Health Behavior/Discharge Planning: ?Goal: Ability to manage health-related needs will improve ?Outcome: Progressing ?  ?Problem: Clinical Measurements: ?Goal: Will remain free from infection ?Outcome: Progressing ?Goal: Diagnostic test results will improve ?Outcome: Progressing ?Goal: Respiratory complications will improve ?Outcome: Progressing ?Goal: Cardiovascular complication will be avoided ?Outcome: Progressing ?  ?Problem: Activity: ?Goal: Risk for activity intolerance will decrease ?Outcome: Progressing ?  ?Problem: Nutrition: ?Goal: Adequate nutrition will be maintained ?Outcome: Progressing ?  ?Problem: Coping: ?Goal: Level of anxiety will decrease ?Outcome: Progressing ?  ?Problem: Elimination: ?Goal: Will not experience complications related to bowel motility ?Outcome: Progressing ?Goal: Will not experience complications related to urinary retention ?Outcome: Progressing ?  ?Problem: Pain Managment: ?Goal: General experience of comfort will improve ?Outcome: Progressing ?  ?Problem: Safety: ?Goal: Ability to remain free from injury will improve ?Outcome: Progressing ?  ?Problem: Skin Integrity: ?Goal: Risk for impaired skin integrity will decrease ?Outcome: Progressing ?  ?Problem: Education: ?Goal: Knowledge of the prescribed therapeutic regimen will improve ?Outcome: Progressing ?  ?Problem: Activity: ?Goal: Ability to avoid complications of mobility impairment will improve ?Outcome: Progressing ?Goal: Range of joint motion will improve ?Outcome: Progressing ?  ?Problem: Clinical Measurements: ?Goal: Postoperative complications will be avoided or minimized ?Outcome: Progressing ?  ?Problem: Pain Management: ?Goal: Pain level will decrease with appropriate  interventions ?Outcome: Progressing ?  ?Problem: Skin Integrity: ?Goal: Will show signs of wound healing ?Outcome: Progressing ?  ?

## 2021-05-19 NOTE — Progress Notes (Signed)
Physical Therapy Treatment ?Patient Details ?Name: Alicia Lambert ?MRN: [id number] ?DOB: 1927-04-03 ?Today's Date: 05/19/2021 ? ? ?History of Present Illness Pt is a 88 y.o. female s/p R TKA 05/13/21 secondary primary localized OA.  PMH includes CAD, LBBB, PVD, aortic atherosclerosis, angina, diastolic dysfunction, pulmonary hypertension, palpitations, HTN, HLD, GERD, OA, RA, memory difficulties, and back surgery. ? ?  ?PT Comments  ? ? Patient received in recliner. She is agreeable to PT session. Patient able to stand with min guard with cues for safety, foot/hand placement. She is able to ambulate 4 feet to [facility] then 4 feet more to bed from [facility]. She requires min assist for ambulation and cues for posture, proximity to RW and safety. Patient will continue to benefit from skilled PT while here to improve functional independence, safety and ROM and strength.    ?Recommendations for follow up therapy are one component of a multi-disciplinary discharge planning process, led by the attending physician.  Recommendations may be updated based on patient status, additional functional criteria and insurance authorization. ? ?Follow Up Recommendations ? Skilled nursing-short term rehab (<3 hours/day) ?  ?  ?Assistance Recommended at Discharge Frequent or constant Supervision/Assistance  ?Patient can return home with the following A lot of help with walking and/or transfers;A lot of help with bathing/dressing/bathroom;Assistance with cooking/housework;Assist for transportation;Help with stairs or ramp for entrance;Direct supervision/assist for medications management ?  ?Equipment Recommendations ? Rolling walker (2 wheels);BSC/3in1  ?  ?Recommendations for Other Services   ? ? ?  ?Precautions / Restrictions Precautions ?Precautions: Fall ?Precaution Comments: wound vac ?Restrictions ?Weight Bearing Restrictions: Yes ?RLE Weight Bearing: Weight bearing as tolerated  ?  ? ?Mobility ? Bed Mobility ?Overal bed mobility: Needs  Assistance ?Bed Mobility: Sit to Supine ?  ?  ?Supine to sit: Mod assist, +2 for physical assistance ?Sit to supine: Min assist ?  ?General bed mobility comments: Min assist to bring legs up onto bed ?  ? ?Transfers ?Overall transfer level: Needs assistance ?Equipment used: Rolling walker (2 wheels) ?Transfers: Sit to/from Stand ?Sit to Stand: Min guard ?Stand pivot transfers: +2 physical assistance, Min assist ?  ?  ?  ?  ?General transfer comment: min guard with cues for foot and hand placement from recliner and BSC ?  ? ?Ambulation/Gait ?Ambulation/Gait assistance: Min assist ?Gait Distance (Feet): 8 Feet ?Assistive device: Rolling walker (2 wheels) ?Gait Pattern/deviations: Step-to pattern, Decreased step length - right, Decreased step length - left, Decreased weight shift to right, Trunk flexed ?Gait velocity: decr ?  ?  ?General Gait Details: patient requires max cues for sequencing, foot placement, proximity to RW, use of UEs on RW. Ambulated 4 feet to [facility] then 4 more feet from [facility] to bed ? ? ?Stairs ?  ?  ?  ?  ?  ? ? ?Wheelchair Mobility ?  ? ?Modified Rankin (Stroke Patients Only) ?  ? ? ?  ?Balance Overall balance assessment: Needs assistance ?Sitting-balance support: Feet supported ?Sitting balance-Leahy Scale: Good ?Sitting balance - Comments: once assisted to sitting, steady sitting reaching within BOS ?  ?Standing balance support: Bilateral upper extremity supported, During functional activity, Reliant on assistive device for balance ?Standing balance-Leahy Scale: Fair ?Standing balance comment: steady static standing with B UE support on RW ?  ?  ?  ?  ?  ?  ?  ?  ?  ?  ?  ?  ? ?  ?Cognition Arousal/Alertness: Awake/alert ?Behavior During Therapy: [facility] for tasks assessed/performed ?Overall Cognitive Status: Within  Functional Limits for tasks assessed ?  ?  ?  ?  ?  ?  ?  ?  ?  ?  ?  ?  ?  ?  ?  ?  ?General Comments: patient quite talkative requires cues to stay on task. A little self limiting. ?  ?   ? ?  ?Exercises Total Joint Exercises ?Ankle Circles/Pumps: AROM, Both, 10 reps ?Hip ABduction/ADduction: AAROM, Right, 10 reps ?Straight Leg Raises: AAROM, Right, 10 reps ?Long Arc Quad: AAROM, Right, 10 reps ?Knee Flexion: AAROM, Right, 5 reps ?Goniometric ROM: 0-70 ? ?  ?General Comments   ?  ?  ? ?Pertinent Vitals/Pain Pain Assessment ?Pain Assessment: Faces ?Faces Pain Scale: Hurts little more ?Pain Location: R knee ?Pain Descriptors / Indicators: Discomfort, Grimacing, Guarding, Heaviness ?Pain Intervention(s): Monitored during session, Repositioned, Ice applied  ? ? ?Home Living   ?  ?  ?  ?  ?  ?  ?  ?  ?  ?   ?  ?Prior Function    ?  ?  ?   ? ?PT Goals (current goals can now be found in the care plan section) Acute Rehab PT Goals ?Patient Stated Goal: go to rehab, get better ?PT Goal Formulation: With patient ?Time For Goal Achievement: 05/27/21 ?Potential to Achieve Goals: Fair ?Progress towards PT goals: Progressing toward goals ? ?  ?Frequency ? ? ? BID ? ? ? ?  ?PT Plan Current plan remains appropriate  ? ? ?Co-evaluation   ?Reason for Co-Treatment: For patient/therapist safety;To address functional/ADL transfers ?PT goals addressed during session: Mobility/safety with mobility;Balance;Proper use of DME ?OT goals addressed during session: ADL's and self-care;Proper use of Adaptive equipment and DME ?  ? ?  ?AM-PAC PT "6 Clicks" Mobility   ?Outcome Measure ? Help needed turning from your back to your side while in a flat bed without using bedrails?: A Lot ?Help needed moving from lying on your back to sitting on the side of a flat bed without using bedrails?: A Lot ?Help needed moving to and from a bed to a chair (including a wheelchair)?: A Lot ?Help needed standing up from a chair using your arms (e.g., wheelchair or bedside chair)?: A Lot ?Help needed to walk in hospital room?: A Lot ?Help needed climbing 3-5 steps with a railing? : Total ?6 Click Score: 11 ? ?  ?End of Session Equipment Utilized  During Treatment: Gait belt ?Activity Tolerance: Patient limited by pain;Patient limited by fatigue ?Patient left: in bed;with call bell/phone within reach;with bed alarm set ?Nurse Communication: Mobility status ?PT Visit Diagnosis: Other abnormalities of gait and mobility (R26.89);Difficulty in walking, not elsewhere classified (R26.2);Pain;Unsteadiness on feet (R26.81);Muscle weakness (generalized) (M62.81) ?Pain - Right/Left: Right ?Pain - part of body: Knee ?  ? ? ?Time: 1332-1400 ?PT Time Calculation (min) (ACUTE ONLY): 28 min ? ?Charges:  $Gait Training: 8-22 mins ?$Therapeutic Activity: 8-22 mins          ?          ? ?Amanda Cockayne, PT, GCS ?05/19/21,2:10 PM ? ?

## 2021-05-19 NOTE — TOC Progression Note (Signed)
Transition of Care (TOC) - Progression Note  ? ? ?Patient Details  ?Name: Alicia Lambert ?MRN: [id number] ?Date of Birth: Jul 05, 1927 ? ?Transition of Care (TOC) CM/SW Contact  ?Conception Oms, RN ?Phone Number: ?05/19/2021, 11:19 AM ? ?Clinical Narrative:   Ins is still pending to go to [facility] ? ? ? ?Expected Discharge Plan: [place] ?Barriers to Discharge: Continued Medical Work up, SNF Pending bed offer, Insurance Authorization ? ?Expected Discharge Plan and Services ?Expected Discharge Plan: [place] ?  ?Discharge Planning Services: CM Consult ?  ?Living arrangements for the past 2 months: [place] ?                ?DME Arranged: N/A ?  ?  ?  ?  ?  ?[place] Agency: NA ?  ?  ?  ? ? ?Social Determinants of Health (SDOH) Interventions ?  ? ?Readmission Risk Interventions ?   ? View : No data to display.  ?  ?  ?  ? ? ?

## 2021-05-19 NOTE — Progress Notes (Signed)
Physical Therapy Treatment ?Patient Details ?Name: Alicia Lambert ?MRN: [id number] ?DOB: 1927/06/05 ?Today's Date: 05/19/2021 ? ? ?History of Present Illness Pt is a 88 y.o. female s/p R TKA 05/13/21 secondary primary localized OA.  PMH includes CAD, LBBB, PVD, aortic atherosclerosis, angina, diastolic dysfunction, pulmonary hypertension, palpitations, HTN, HLD, GERD, OA, RA, memory difficulties, and back surgery. ? ?  ?PT Comments  ? ? Patient received in bed, OT present in room. She is agreeable to session. Patient with decreased initiation, requiring max and constant cues for mobility. Easily distracted and requires cues to remain on task. She performed bed mobility with +2 mod assist. Transfers with min +2 assist. She is able to side step about 4 feet with RW and cues to try to pick up right LE and take steps with little success. Patient will continue to benefit from skilled PT while here to improve functional independence, strength and safety with mobility.  ?      ?Recommendations for follow up therapy are one component of a multi-disciplinary discharge planning process, led by the attending physician.  Recommendations may be updated based on patient status, additional functional criteria and insurance authorization. ? ?Follow Up Recommendations ? Skilled nursing-short term rehab (<3 hours/day) ?  ?  ?Assistance Recommended at Discharge Frequent or constant Supervision/Assistance  ?Patient can return home with the following A lot of help with walking and/or transfers;A lot of help with bathing/dressing/bathroom;Assistance with cooking/housework;Assist for transportation;Help with stairs or ramp for entrance;Direct supervision/assist for medications management ?  ?Equipment Recommendations ? Rolling walker (2 wheels);BSC/3in1  ?  ?Recommendations for Other Services   ? ? ?  ?Precautions / Restrictions Precautions ?Precautions: Fall ?Restrictions ?Weight Bearing Restrictions: Yes ?RLE Weight Bearing: Weight  bearing as tolerated  ?  ? ?Mobility ? Bed Mobility ?Overal bed mobility: Needs Assistance ?Bed Mobility: Supine to Sit ?  ?  ?Supine to sit: Mod assist, +2 for physical assistance ?  ?  ?General bed mobility comments: little self initiation. Requires step by step cues. ?  ? ?Transfers ?Overall transfer level: Needs assistance ?Equipment used: Rolling walker (2 wheels) ?Transfers: Sit to/from Stand, Bed to chair/wheelchair/BSC ?Sit to Stand: Min assist ?Stand pivot transfers: +2 physical assistance, Min assist ?  ?  ?  ?  ?General transfer comment: SPT from bed to [facility], Cues needed to use UEs. Upright posture. ?  ? ?Ambulation/Gait ?Ambulation/Gait assistance: Min assist ?Gait Distance (Feet): 4 Feet ?Assistive device: Rolling walker (2 wheels) ?Gait Pattern/deviations: Step-to pattern, Decreased step length - right, Decreased step length - left, Decreased stride length, Trunk flexed ?Gait velocity: decr ?  ?  ?General Gait Details: Patient able to side step from [facility] to recliner approx 4 feet. Cues needed to push through arms. Pick up right LE rather than sliding/scooting on floor. Patient demonstrates slight improvement from prior session. ? ? ?Stairs ?  ?  ?  ?  ?  ? ? ?Wheelchair Mobility ?  ? ?Modified Rankin (Stroke Patients Only) ?  ? ? ?  ?Balance Overall balance assessment: Needs assistance ?Sitting-balance support: Feet supported ?Sitting balance-Leahy Scale: Good ?Sitting balance - Comments: once assisted to sitting, steady sitting reaching within BOS ?  ?Standing balance support: Bilateral upper extremity supported, During functional activity, Reliant on assistive device for balance ?Standing balance-Leahy Scale: Fair ?Standing balance comment: steady static standing with B UE support on RW ?  ?  ?  ?  ?  ?  ?  ?  ?  ?  ?  ?  ? ?  ?  Cognition Arousal/Alertness: Awake/alert ?Behavior During Therapy: [facility] for tasks assessed/performed ?Overall Cognitive Status: Within Functional Limits for tasks assessed ?  ?   ?  ?  ?  ?  ?  ?  ?  ?  ?  ?  ?  ?  ?  ?  ?General Comments: patient quite talkative requires cues to stay on task. A little self limiting. ?  ?  ? ?  ?Exercises Total Joint Exercises ?Ankle Circles/Pumps: AROM, Both, 10 reps ?Hip ABduction/ADduction: AAROM, Right, 10 reps ?Straight Leg Raises: AAROM, Right, 10 reps ?Long Arc Quad: AAROM, Right, 10 reps ?Knee Flexion: AAROM, Right, 5 reps ?Goniometric ROM: 0-70 ? ?  ?General Comments   ?  ?  ? ?Pertinent Vitals/Pain Pain Assessment ?Pain Assessment: Faces ?Faces Pain Scale: Hurts even more ?Pain Location: R knee ?Pain Descriptors / Indicators: Discomfort, Grimacing, Guarding, Heaviness ?Pain Intervention(s): Limited activity within patient's tolerance, Premedicated before session, Monitored during session, Repositioned, Ice applied  ? ? ?Home Living   ?  ?  ?  ?  ?  ?  ?  ?  ?  ?   ?  ?Prior Function    ?  ?  ?   ? ?PT Goals (current goals can now be found in the care plan section) Acute Rehab PT Goals ?Patient Stated Goal: go to rehab, get better ?PT Goal Formulation: With patient ?Time For Goal Achievement: 05/27/21 ?Potential to Achieve Goals: Fair ?Progress towards PT goals: Progressing toward goals ? ?  ?Frequency ? ? ? BID ? ? ? ?  ?PT Plan Current plan remains appropriate  ? ? ?Co-evaluation   ?Reason for Co-Treatment: For patient/therapist safety;To address functional/ADL transfers ?PT goals addressed during session: Mobility/safety with mobility;Balance;Proper use of DME ?  ?  ? ?  ?AM-PAC PT "6 Clicks" Mobility   ?Outcome Measure ? Help needed turning from your back to your side while in a flat bed without using bedrails?: A Lot ?Help needed moving from lying on your back to sitting on the side of a flat bed without using bedrails?: A Lot ?Help needed moving to and from a bed to a chair (including a wheelchair)?: A Lot ?Help needed standing up from a chair using your arms (e.g., wheelchair or bedside chair)?: A Lot ?Help needed to walk in hospital room?:  Total ?Help needed climbing 3-5 steps with a railing? : Total ?6 Click Score: 10 ? ?  ?End of Session Equipment Utilized During Treatment: Gait belt ?Activity Tolerance: Patient limited by pain;Patient limited by fatigue ?Patient left: in chair;with call bell/phone within reach;with chair alarm set ?Nurse Communication: Mobility status ?PT Visit Diagnosis: Muscle weakness (generalized) (M62.81);Difficulty in walking, not elsewhere classified (R26.2);Other abnormalities of gait and mobility (R26.89);Pain ?Pain - Right/Left: Right ?Pain - part of body: Knee ?  ? ? ?Time: 5027-7412 ?PT Time Calculation (min) (ACUTE ONLY): 32 min ? ?Charges:  $Gait Training: 8-22 mins          ?          ? ?Amanda Cockayne, PT, GCS ?05/19/21,10:21 AM ? ? ?

## 2021-05-19 NOTE — Progress Notes (Signed)
? ?  Subjective: ?6 Days Post-Op Procedure(s) (LRB): ?TOTAL KNEE ARTHROPLASTY (Right) ?Patient reports pain as mild.   ?Patient is well, and has had no acute complaints or problems ?Denies any CP, SOB, ABD pain. ?We will continue therapy today.  ?Plan is to go Skilled nursing facility after hospital stay. ? ?Objective: ?Vital signs in last 24 hours: ?Temp:  [97.5 ?F (36.4 ?C)-98.7 ?F (37.1 ?C)] 98.7 ?F (37.1 ?C) (03/22 0730) ?Pulse Rate:  [53-75] 68 (03/22 0730) ?Resp:  [15-20] 15 (03/22 0730) ?BP: (105-151)/(39-83) 108/39 (03/22 0730) ?SpO2:  [92 %-99 %] 94 % (03/22 0730) ? ?Intake/Output from previous day: ?03/21 0701 - 03/22 0700 ?In: -  ?Out: 8185 [[id number]] ?Intake/Output this shift: ?No intake/output data recorded. ? ?Recent Labs  ?  05/17/21 ?0806  ?HGB 8.0*  ? ?Recent Labs  ?  05/17/21 ?0806  ?WBC 7.6  ?RBC 2.52*  ?HCT 25.4*  ?PLT 182  ? ?Recent Labs  ?  05/17/21 ?0806  ?NA 135  ?K 3.9  ?CL 103  ?CO2 26  ?BUN 13  ?CREATININE 0.91  ?GLUCOSE 94  ?CALCIUM 8.2*  ? ?No results for input(s): LABPT, INR in the last 72 hours. ? ?EXAM ?General - Patient is Alert, Appropriate, and Oriented ?Extremity - Neurovascular intact ?Sensation intact distally ?Intact pulses distally ?Dorsiflexion/Plantar flexion intact ?Dressing - dressing C/D/I and no drainage ?Motor Function - intact, moving foot and toes well on exam.  ? ?Past Medical History:  ?Diagnosis Date  ? Anginal pain ([place])   ? Aortic atherosclerosis ([place])   ? B12 deficiency   ? Basal cell carcinoma (BCC) of skin of nose   ? Bilateral cataracts   ? a.) s/p extraction  ? Bilateral occipital neuralgia   ? Chondrocalcinosis   ? Coronary artery disease 07/21/2016  ? a.) LHC 07/21/2016: EF 55-65%; LVEDP norm; 25% pLCx; no intervention.  ? Diastolic dysfunction 37/85/8850  ? a.) TTE 03/17/2017: EF 45%; mild LVH, mod LA enlargement, global HK, G1DD. b.) TTE 12/12/2017: EF 45%; global HK, mild LVH adn LA enlargement; triv PR, mild AR/MR, mod TR; G1DD. c.) TTE 05/13/2019: EF  45%; mild LVH, mild LA enlargement; triv PR, mild MR/TR, mod AR; G1DD.  ? GERD (gastroesophageal reflux disease)   ? Headache disorder   ? HLD (hyperlipidemia)   ? Hypertension   ? LBBB (left bundle branch block)   ? Memory difficulty   ? Osteoarthritis   ? Palpitations   ? Pulmonary HTN ([place]) 03/17/2017  ? a.) TTE 03/17/2017: EF 45%; RVSP 36.9 mmHg. b.) TTE 12/15/2017: EF 45%; RVSP 40.3 mmHg. c.) TTE 05/13/2019; EF 45%; EF 45%; RVSP 42.0 mmHg  ? PVD (peripheral vascular disease) ([place])   ? Rheumatoid arthritis ([place])   ? ? ?Assessment/Plan:   ?6 Days Post-Op Procedure(s) (LRB): ?TOTAL KNEE ARTHROPLASTY (Right) ?Principal Problem: ?  S/P TKR (total knee replacement) using cement, right ? ?Estimated body mass index is 25.75 kg/m? as calculated from the following: ?  Height as of this encounter: '5\' 4"'$  (1.626 m). ?  Weight as of this encounter: 68 kg. ?Advance diet ?Up with therapy ?Vital signs are stable ?Acute postop blood loss anemia -hemoglobin 8.0.  Stable ?Patient overall doing well, care management to assist with discharge to skilled nursing facility. ? ?DVT Prophylaxis - Lovenox, TED hose, and SCDs ?Weight-Bearing as tolerated to right leg ? ? ?T. Rachelle Hora, PA-C ?[place] ?05/19/2021, 8:13 AM ?  ?

## 2021-05-19 NOTE — TOC Progression Note (Signed)
Transition of Care (TOC) - Progression Note  ? ? ?Patient Details  ?Name: Alicia Lambert ?MRN: [id number] ?Date of Birth: [birth year] ? ?Transition of Care (TOC) CM/SW Contact  ?Conception Oms, RN ?Phone Number: ?05/19/2021, 3:24 PM ? ?Clinical Narrative:   Ins approved to go to [facility] tomorrow, EMS will trasnsport ? ? ? ?Expected Discharge Plan: [place] ?Barriers to Discharge: Continued Medical Work up, SNF Pending bed offer, Insurance Authorization ? ?Expected Discharge Plan and Services ?Expected Discharge Plan: [place] ?  ?Discharge Planning Services: CM Consult ?  ?Living arrangements for the past 2 months: [place] ?                ?DME Arranged: N/A ?  ?  ?  ?  ?  ?[place] Agency: NA ?  ?  ?  ? ? ?Social Determinants of Health (SDOH) Interventions ?  ? ?Readmission Risk Interventions ?   ? View : No data to display.  ?  ?  ?  ? ? ?

## 2021-05-19 NOTE — Progress Notes (Signed)
Occupational Therapy Treatment ?Patient Details ?Name: Alicia Lambert ?MRN: [id number] ?DOB: Sep 07, 1927 ?Today's Date: 05/19/2021 ? ? ?History of present illness Pt is a 88 y.o. female s/p R TKA 05/13/21 secondary primary localized OA.  PMH includes CAD, LBBB, PVD, aortic atherosclerosis, angina, diastolic dysfunction, pulmonary hypertension, palpitations, HTN, HLD, GERD, OA, RA, memory difficulties, and back surgery. ?  ?OT comments ? Pt seen for OT tx and co-tx with PT. Pt required MOD A +2 for bed mobility this date + extra time and RLE support to slowly allow R knee to bend. MIN A +2 to stand with VC for RW and attempting to pick up her feet versus shuffle. Pt set up for pericare using lateral lean with supervision. Pt continues to benefit from skilled OT services and SNF remains appropriate.   ? ?Recommendations for follow up therapy are one component of a multi-disciplinary discharge planning process, led by the attending physician.  Recommendations may be updated based on patient status, additional functional criteria and insurance authorization. ?   ?Follow Up Recommendations ? Skilled nursing-short term rehab (<3 hours/day)  ?  ?Assistance Recommended at Discharge Frequent or constant Supervision/Assistance  ?Patient can return home with the following ? A lot of help with walking and/or transfers;A lot of help with bathing/dressing/bathroom;Assistance with cooking/housework;Assist for transportation;Help with stairs or ramp for entrance;Direct supervision/assist for financial management;Direct supervision/assist for medications management ?  ?Equipment Recommendations ? BSC/3in1  ?  ?Recommendations for Other Services   ? ?  ?Precautions / Restrictions Precautions ?Precautions: Fall ?Precaution Comments: wound vac ?Restrictions ?Weight Bearing Restrictions: Yes ?RLE Weight Bearing: Weight bearing as tolerated  ? ? ?  ? ?Mobility Bed Mobility ?Overal bed mobility: Needs Assistance ?Bed Mobility: Supine to  Sit ?  ?  ?Supine to sit: Mod assist, +2 for physical assistance ?  ?  ?General bed mobility comments: little self initiation. Requires step by step cues. Anxious/anticipates pain ?  ? ?Transfers ?Overall transfer level: Needs assistance ?Equipment used: Rolling walker (2 wheels) ?Transfers: Sit to/from Stand, Bed to chair/wheelchair/BSC ?Sit to Stand: Min assist ?Stand pivot transfers: +2 physical assistance, Min assist ?  ?  ?  ?  ?General transfer comment: SPT from bed to [facility], Cues needed to use UEs. Upright posture. ?  ?  ?Balance Overall balance assessment: Needs assistance ?Sitting-balance support: Feet supported ?Sitting balance-Leahy Scale: Good ?  ?Postural control: Posterior lean ?Standing balance support: Bilateral upper extremity supported, During functional activity, Reliant on assistive device for balance ?Standing balance-Leahy Scale: Fair ?  ?  ?  ?  ?  ?  ?  ?  ?  ?  ?  ?  ?   ? ?ADL either performed or assessed with clinical judgement  ? ?ADL Overall ADL's : Needs assistance/impaired ?  ?  ?  ?  ?  ?  ?  ?  ?  ?  ?  ?  ?Toilet Transfer: Rolling walker (2 wheels);Minimal assistance;Min guard;Ambulation;Cueing for safety;Cueing for sequencing ?Toilet Transfer Details (indicate cue type and reason): increased time/effort, tendency to shuffle feet, ?  ?  ?  ?  ?  ?  ?  ? ?Extremity/Trunk Assessment   ?  ?  ?  ?  ?  ? ?Vision   ?  ?  ?Perception   ?  ?Praxis   ?  ? ?Cognition Arousal/Alertness: Awake/alert ?Behavior During Therapy: [facility] for tasks assessed/performed ?Overall Cognitive Status: Within Functional Limits for tasks assessed ?  ?  ?  ?  ?  ?  ?  ?  ?  ?  ?  ?  ?  ?  ?  ?  ?  General Comments: patient quite talkative requires cues to stay on task. A little self limiting. ?  ?  ?   ?Exercises   ? ?  ?Shoulder Instructions   ? ? ?  ?General Comments    ? ? ?Pertinent Vitals/ Pain       Pain Assessment ?Pain Assessment: Faces ?Faces Pain Scale: Hurts even more ?Pain Location: R knee ?Pain Descriptors  / Indicators: Discomfort, Grimacing, Guarding, Heaviness ?Pain Intervention(s): Limited activity within patient's tolerance, Monitored during session, Premedicated before session, Repositioned, Ice applied ? ?Home Living   ?  ?  ?  ?  ?  ?  ?  ?  ?  ?  ?  ?  ?  ?  ?  ?  ?  ?  ? ?  ?Prior Functioning/Environment    ?  ?  ?  ?   ? ?Frequency ? Min 2X/week  ? ? ? ? ?  ?Progress Toward Goals ? ?OT Goals(current goals can now be found in the care plan section) ? Progress towards OT goals: Progressing toward goals ? ?Acute Rehab OT Goals ?Patient Stated Goal: feel better ?OT Goal Formulation: With patient ?Time For Goal Achievement: 05/28/21 ?Potential to Achieve Goals: Good  ?Plan Discharge plan remains appropriate;Frequency remains appropriate   ? ?Co-evaluation ? ? ? PT/OT/SLP Co-Evaluation/Treatment: Yes ?Reason for Co-Treatment: For patient/therapist safety;To address functional/ADL transfers ?PT goals addressed during session: Mobility/safety with mobility;Balance;Proper use of DME ?OT goals addressed during session: ADL's and self-care;Proper use of Adaptive equipment and DME ?  ? ?  ?AM-PAC OT "6 Clicks" Daily Activity     ?Outcome Measure ? ? Help from another person eating meals?: None ?Help from another person taking care of personal grooming?: A Little ?Help from another person toileting, which includes using toliet, bedpan, or urinal?: A Lot ?Help from another person bathing (including washing, rinsing, drying)?: A Lot ?Help from another person to put on and taking off regular upper body clothing?: A Little ?Help from another person to put on and taking off regular lower body clothing?: A Lot ?6 Click Score: 16 ? ?  ?End of Session Equipment Utilized During Treatment: Gait belt;Rolling walker (2 wheels) ? ?OT Visit Diagnosis: Other abnormalities of gait and mobility (R26.89);Muscle weakness (generalized) (M62.81);Pain ?Pain - Right/Left: Right ?Pain - part of body: Knee ?  ?Activity Tolerance Patient  tolerated treatment well ?  ?Patient Left Other (comment);in chair;with call bell/phone within reach (with PT) ?  ?Nurse Communication   ?  ? ?   ? ?Time: 4742-5956 ?OT Time Calculation (min): 24 min ? ?Charges: OT General Charges ?$OT Visit: 1 Visit ?OT Treatments ?$Self Care/Home Management : 8-22 mins ? ?Ardeth Perfect., MPH, MS, OTR/L ?ascom [phone] ?05/19/21, 12:35 PM ? ?

## 2021-05-20 LAB — CREATININE, SERUM
Creatinine, Ser: 0.92 mg/dL (ref 0.44–1.00)
GFR, Estimated: 59 mL/min — ABNORMAL LOW (ref >60)

## 2021-05-20 MED ORDER — POLYETHYLENE GLYCOL 3350 17 G PO PACK: 17.0000 g | PACK | Freq: Every day | ORAL | 0 refills | Status: AC

## 2021-05-20 NOTE — Care Management Important Message (Signed)
Important Message ? ?Patient Details  ?Name: Alicia Lambert ?MRN: [id number] ?Date of Birth: 1927/04/08 ? ? ?Medicare Important Message Given:  Yes ? ? ? ? ?[name] ?05/20/2021, 12:15 PM ?

## 2021-05-20 NOTE — TOC Progression Note (Signed)
Transition of Care (TOC) - Progression Note  ? ? ?Patient Details  ?Name: Alicia Lambert ?MRN: [id number] ?Date of Birth: 02-17-1928 ? ?Transition of Care (TOC) CM/SW Contact  ?Conception Oms, RN ?Phone Number: ?05/20/2021, 11:05 AM ? ?Clinical Narrative:    ?The Patient is going to [facility] room 224A, Her Son [name] is aware and that it is a semi Private room, he stated agreeance, EMS called and there are 2 ahead of her, the bedside Nurse to call report ? ? ?Expected Discharge Plan: [place] ?Barriers to Discharge: Continued Medical Work up, SNF Pending bed offer, Insurance Authorization ? ?Expected Discharge Plan and Services ?Expected Discharge Plan: [place] ?  ?Discharge Planning Services: CM Consult ?  ?Living arrangements for the past 2 months: [place] ?Expected Discharge Date: 05/20/21               ?DME Arranged: N/A ?  ?  ?  ?  ?  ?[place] Agency: NA ?  ?  ?  ? ? ?Social Determinants of Health (SDOH) Interventions ?  ? ?Readmission Risk Interventions ?   ? View : No data to display.  ?  ?  ?  ? ? ?

## 2021-05-20 NOTE — Progress Notes (Incomplete)
[place] and reported to [place], Therapist, sports. ?

## 2021-05-20 NOTE — Progress Notes (Signed)
? ?  Subjective: ?7 Days Post-Op Procedure(s) (LRB): ?TOTAL KNEE ARTHROPLASTY (Right) ?Patient reports pain as mild.   ?Patient is well, and has had no acute complaints or problems ?Denies any CP, SOB, ABD pain. ?We will continue therapy today.  ?Plan is to go Skilled nursing facility after hospital stay. ? ?Objective: ?Vital signs in last 24 hours: ?Temp:  [98 ?F (36.7 ?C)-99 ?F (37.2 ?C)] 98.7 ?F (37.1 ?C) (03/23 6226) ?Pulse Rate:  [63-69] 69 (03/23 0438) ?Resp:  [16-20] 16 (03/23 0438) ?BP: (121-153)/(60-73) 153/73 (03/23 3335) ?SpO2:  [94 %-100 %] 94 % (03/23 0438) ? ?Intake/Output from previous day: ?03/22 0701 - 03/23 0700 ?In: 360 [P.O.:360] ?Out: 215 [Urine:200; Drains:15] ?Intake/Output this shift: ?No intake/output data recorded. ? ?No results for input(s): HGB in the last 72 hours. ? ?No results for input(s): WBC, RBC, HCT, PLT in the last 72 hours. ? ?Recent Labs  ?  05/20/21 ?4562  ?CREATININE 0.92  ? ?No results for input(s): LABPT, INR in the last 72 hours. ? ?EXAM ?General - Patient is Alert, Appropriate, and Oriented ?Extremity - Neurovascular intact ?Sensation intact distally ?Intact pulses distally ?Dorsiflexion/Plantar flexion intact ?Dressing - dressing C/D/I and no drainage ?Motor Function - intact, moving foot and toes well on exam.  ? ?Past Medical History:  ?Diagnosis Date  ? Anginal pain ([place])   ? Aortic atherosclerosis ([place])   ? B12 deficiency   ? Basal cell carcinoma (BCC) of skin of nose   ? Bilateral cataracts   ? a.) s/p extraction  ? Bilateral occipital neuralgia   ? Chondrocalcinosis   ? Coronary artery disease 07/21/2016  ? a.) LHC 07/21/2016: EF 55-65%; LVEDP norm; 25% pLCx; no intervention.  ? Diastolic dysfunction 56/38/9373  ? a.) TTE 03/17/2017: EF 45%; mild LVH, mod LA enlargement, global HK, G1DD. b.) TTE 12/12/2017: EF 45%; global HK, mild LVH adn LA enlargement; triv PR, mild AR/MR, mod TR; G1DD. c.) TTE 05/13/2019: EF 45%; mild LVH, mild LA enlargement; triv PR, mild  MR/TR, mod AR; G1DD.  ? GERD (gastroesophageal reflux disease)   ? Headache disorder   ? HLD (hyperlipidemia)   ? Hypertension   ? LBBB (left bundle branch block)   ? Memory difficulty   ? Osteoarthritis   ? Palpitations   ? Pulmonary HTN ([place]) 03/17/2017  ? a.) TTE 03/17/2017: EF 45%; RVSP 36.9 mmHg. b.) TTE 12/15/2017: EF 45%; RVSP 40.3 mmHg. c.) TTE 05/13/2019; EF 45%; EF 45%; RVSP 42.0 mmHg  ? PVD (peripheral vascular disease) ([place])   ? Rheumatoid arthritis ([place])   ? ? ?Assessment/Plan:   ?7 Days Post-Op Procedure(s) (LRB): ?TOTAL KNEE ARTHROPLASTY (Right) ?Principal Problem: ?  S/P TKR (total knee replacement) using cement, right ? ?Estimated body mass index is 25.75 kg/m? as calculated from the following: ?  Height as of this encounter: '5\' 4"'$  (1.626 m). ?  Weight as of this encounter: 68 kg. ?Advance diet ?Up with therapy ?Vital signs are stable ?Acute postop blood loss anemia -hemoglobin 8.0.  Stable ?Patient overall doing well, care management to assist with discharge to skilled nursing facility today ?Follow up with [facility] in 1 week for staple removal ? ?DVT Prophylaxis - Lovenox, TED hose, and SCDs ?Weight-Bearing as tolerated to right leg ? ? ?T. Rachelle Hora, PA-C ?[place] ?05/20/2021, 10:31 AM ?  ?

## 2021-05-20 NOTE — Progress Notes (Signed)
EMS transported pt from [facility] to [facility].  ?

## 2021-05-20 NOTE — Progress Notes (Signed)
Physical Therapy Treatment ?Patient Details ?Name: Alicia Lambert ?MRN: [id number] ?DOB: [birth year] ?Today's Date: 05/20/2021 ? ? ?History of Present Illness Pt is a 88 y.o. female s/p R TKA 05/13/21 secondary primary localized OA.  PMH includes CAD, LBBB, PVD, aortic atherosclerosis, angina, diastolic dysfunction, pulmonary hypertension, palpitations, HTN, HLD, GERD, OA, RA, memory difficulties, and back surgery. ? ?  ?PT Comments  ? ? Pt sitting on BSC (with nursing tech present) upon PT arrival.  During transfers--CGA to stand but min assist to control descent sitting with RW use; min assist stand step turn BSC to recliner with RW use; and then CGA to min assist to ambulate 20 feet with RW use with chair follow (limited distance d/t pt reporting 10/10 R knee pain and nausea--nurse present and aware).  Pt's R knee pain improved to 4/10 with rest (polar care also applied).  Will continue to focus on strengthening, knee ROM, and progressive functional mobility per pt tolerance. ?  ?Recommendations for follow up therapy are one component of a multi-disciplinary discharge planning process, led by the attending physician.  Recommendations may be updated based on patient status, additional functional criteria and insurance authorization. ? ?Follow Up Recommendations ? Skilled nursing-short term rehab (<3 hours/day) ?  ?  ?Assistance Recommended at Discharge Frequent or constant Supervision/Assistance  ?Patient can return home with the following A lot of help with bathing/dressing/bathroom;Assistance with cooking/housework;Assist for transportation;Help with stairs or ramp for entrance;Direct supervision/assist for medications management;A lot of help with walking and/or transfers ?  ?Equipment Recommendations ? Rolling walker (2 wheels);BSC/3in1  ?  ?Recommendations for Other Services OT consult ? ? ?  ?Precautions / Restrictions Precautions ?Precautions: Fall ?Precaution Comments: wound vac ?Restrictions ?Weight Bearing  Restrictions: Yes ?RLE Weight Bearing: Weight bearing as tolerated  ?  ? ?Mobility ? Bed Mobility ?  ?  ?  ?  ?  ?  ?  ?General bed mobility comments: Deferred (pt on [facility] upon PT arrival) ?  ? ?Transfers ?Overall transfer level: Needs assistance ?Equipment used: Rolling walker (2 wheels) ?Transfers: Sit to/from Stand ?Sit to Stand: Min guard, Min assist ?  ?Step pivot transfers: Min assist (stand step turn BSC to recliner with RW use) ?  ?  ?  ?General transfer comment: x1 trial from [facility] and x1 trial from recliner; increased effort and time for pt to stand on own (CGA); vc's for UE/LE placement with transfers; assist to control descent sitting (min assist) ?  ? ?Ambulation/Gait ?Ambulation/Gait assistance: Min guard, Min assist ?Gait Distance (Feet): 20 Feet ?Assistive device: Rolling walker (2 wheels) ?  ?Gait velocity: decreased ?  ?  ?General Gait Details: antalgic; decreased stance time R LE; max cueing to increase UE support on RW to offweight R LE when advancing L LE; assist for walker navigation; increased effort/time to advance R LE; decreased B LE step length and foot clearance ? ? ?Stairs ?  ?  ?  ?  ?  ? ? ?Wheelchair Mobility ?  ? ?Modified Rankin (Stroke Patients Only) ?  ? ? ?  ?Balance Overall balance assessment: Needs assistance ?Sitting-balance support: No upper extremity supported, Feet supported ?Sitting balance-Leahy Scale: Good ?Sitting balance - Comments: steady sitting reaching within BOS ?  ?Standing balance support: Bilateral upper extremity supported, During functional activity, Reliant on assistive device for balance ?Standing balance-Leahy Scale: Fair ?Standing balance comment: steady static standing with B UE support on RW ?  ?  ?  ?  ?  ?  ?  ?  ?  ?  ?  ?  ? ?  ?  Cognition Arousal/Alertness: Awake/alert ?Behavior During Therapy: [facility] for tasks assessed/performed ?Overall Cognitive Status: Within Functional Limits for tasks assessed ?  ?  ?  ?  ?  ?  ?  ?  ?  ?  ?  ?  ?  ?  ?  ?  ?  ?  ?   ? ?  ?Exercises Total Joint Exercises ?Goniometric ROM: 5-70 degrees (limited d/t R knee pain) ? ?  ?General Comments  Pt agreeable to PT session. ?  ?  ? ?Pertinent Vitals/Pain Pain Assessment ?Pain Assessment: 0-10 ?Pain Score: 4  ?Pain Descriptors / Indicators: Aching, Sore, Tender ?Pain Intervention(s): Limited activity within patient's tolerance, Monitored during session, Premedicated before session, Repositioned, Other (comment) (polar care applied) ?Vitals (HR and O2 on room air) stable and WFL throughout treatment session.  ? ? ?Home Living   ?  ?  ?  ?  ?  ?  ?  ?  ?  ?   ?  ?Prior Function    ?  ?  ?   ? ?PT Goals (current goals can now be found in the care plan section) Acute Rehab PT Goals ?Patient Stated Goal: go to rehab, get better ?PT Goal Formulation: With patient ?Time For Goal Achievement: 05/27/21 ?Potential to Achieve Goals: Fair ?Progress towards PT goals: Progressing toward goals ? ?  ?Frequency ? ? ? BID ? ? ? ?  ?PT Plan Current plan remains appropriate  ? ? ?Co-evaluation   ?  ?  ?  ?  ? ?  ?AM-PAC PT "6 Clicks" Mobility   ?Outcome Measure ? Help needed turning from your back to your side while in a flat bed without using bedrails?: A Lot ?Help needed moving from lying on your back to sitting on the side of a flat bed without using bedrails?: A Lot ?Help needed moving to and from a bed to a chair (including a wheelchair)?: A Little ?Help needed standing up from a chair using your arms (e.g., wheelchair or bedside chair)?: A Little ?Help needed to walk in hospital room?: A Lot ?Help needed climbing 3-5 steps with a railing? : Total ?6 Click Score: 13 ? ?  ?End of Session Equipment Utilized During Treatment: Gait belt ?Activity Tolerance: Patient limited by pain;Patient limited by fatigue ?Patient left: in chair;with call bell/phone within reach;with chair alarm set;with SCD's reapplied;Other (comment) (B heels floating via towel rolls; polar care in place) ?Nurse Communication: Mobility  status;Precautions;Weight bearing status;Patient requests pain meds;Other (comment) (pt's pain status and nausea) ?PT Visit Diagnosis: Other abnormalities of gait and mobility (R26.89);Difficulty in walking, not elsewhere classified (R26.2);Pain;Unsteadiness on feet (R26.81);Muscle weakness (generalized) (M62.81) ?Pain - Right/Left: Right ?Pain - part of body: Knee ?  ? ? ?Time: 4098-1191 ?PT Time Calculation (min) (ACUTE ONLY): 29 min ? ?Charges:  $Gait Training: 8-22 mins ?$Therapeutic Activity: 8-22 mins          ?          ?Leitha Bleak, PT ?05/20/21, 10:31 AM ? ? ?

## 2021-12-27 ENCOUNTER — Encounter (INDEPENDENT_AMBULATORY_CARE_PROVIDER_SITE_OTHER): Payer: Self-pay
